# Patient Record
Sex: Female | Born: 1960 | Race: White | Hispanic: No | Marital: Married | Smoking: Never smoker
Health system: Southern US, Community
[De-identification: ages and names within clinical notes are randomized; demographics above are authoritative.]

## PROBLEM LIST (undated history)

## (undated) DIAGNOSIS — M1990 Unspecified osteoarthritis, unspecified site: Secondary | ICD-10-CM

## (undated) DIAGNOSIS — M5136 Other intervertebral disc degeneration, lumbar region: Secondary | ICD-10-CM

## (undated) DIAGNOSIS — M797 Fibromyalgia: Secondary | ICD-10-CM

## (undated) DIAGNOSIS — F329 Major depressive disorder, single episode, unspecified: Secondary | ICD-10-CM

## (undated) DIAGNOSIS — M5134 Other intervertebral disc degeneration, thoracic region: Secondary | ICD-10-CM

## (undated) DIAGNOSIS — M25511 Pain in right shoulder: Secondary | ICD-10-CM

## (undated) DIAGNOSIS — F32A Depression, unspecified: Secondary | ICD-10-CM

## (undated) DIAGNOSIS — E871 Hypo-osmolality and hyponatremia: Secondary | ICD-10-CM

## (undated) DIAGNOSIS — M3500 Sicca syndrome, unspecified: Secondary | ICD-10-CM

## (undated) DIAGNOSIS — M51369 Other intervertebral disc degeneration, lumbar region without mention of lumbar back pain or lower extremity pain: Secondary | ICD-10-CM

## (undated) DIAGNOSIS — T7840XA Allergy, unspecified, initial encounter: Secondary | ICD-10-CM

## (undated) DIAGNOSIS — E7800 Pure hypercholesterolemia, unspecified: Secondary | ICD-10-CM

## (undated) DIAGNOSIS — I10 Essential (primary) hypertension: Secondary | ICD-10-CM

## (undated) DIAGNOSIS — Q761 Klippel-Feil syndrome: Secondary | ICD-10-CM

## (undated) DIAGNOSIS — M069 Rheumatoid arthritis, unspecified: Secondary | ICD-10-CM

## (undated) DIAGNOSIS — K219 Gastro-esophageal reflux disease without esophagitis: Secondary | ICD-10-CM

## (undated) DIAGNOSIS — M26609 Unspecified temporomandibular joint disorder, unspecified side: Secondary | ICD-10-CM

## (undated) DIAGNOSIS — M5030 Other cervical disc degeneration, unspecified cervical region: Secondary | ICD-10-CM

## (undated) DIAGNOSIS — I7300 Raynaud's syndrome without gangrene: Secondary | ICD-10-CM

## (undated) HISTORY — DX: Allergy, unspecified, initial encounter: T78.40XA

## (undated) HISTORY — PX: CHOLECYSTECTOMY: SHX55

## (undated) HISTORY — DX: Hypo-osmolality and hyponatremia: E87.1

## (undated) HISTORY — PX: CARPAL TUNNEL RELEASE: SHX101

## (undated) HISTORY — DX: Depression, unspecified: F32.A

## (undated) HISTORY — DX: Pure hypercholesterolemia, unspecified: E78.00

## (undated) HISTORY — PX: ERCP: SHX60

## (undated) HISTORY — DX: Other intervertebral disc degeneration, thoracic region: M51.34

## (undated) HISTORY — DX: Klippel-Feil syndrome: Q76.1

## (undated) HISTORY — DX: Raynaud's syndrome without gangrene: I73.00

## (undated) HISTORY — DX: Sjogren syndrome, unspecified: M35.00

## (undated) HISTORY — DX: Rheumatoid arthritis, unspecified: M06.9

## (undated) HISTORY — DX: Pain in right shoulder: M25.511

## (undated) HISTORY — DX: Gastro-esophageal reflux disease without esophagitis: K21.9

## (undated) HISTORY — PX: KNEE SURGERY: SHX244

## (undated) HISTORY — PX: ABDOMINAL HYSTERECTOMY: SHX81

## (undated) HISTORY — DX: Unspecified temporomandibular joint disorder, unspecified side: M26.609

## (undated) HISTORY — DX: Essential (primary) hypertension: I10

## (undated) HISTORY — PX: APPENDECTOMY: SHX54

## (undated) HISTORY — DX: Unspecified osteoarthritis, unspecified site: M19.90

## (undated) HISTORY — DX: Fibromyalgia: M79.7

## (undated) HISTORY — PX: BACK SURGERY: SHX140

## (undated) HISTORY — DX: Other intervertebral disc degeneration, lumbar region: M51.36

## (undated) HISTORY — DX: Other cervical disc degeneration, unspecified cervical region: M50.30

## (undated) HISTORY — PX: TUBAL LIGATION: SHX77

## (undated) HISTORY — DX: Other intervertebral disc degeneration, lumbar region without mention of lumbar back pain or lower extremity pain: M51.369

## (undated) HISTORY — PX: OTHER SURGICAL HISTORY: SHX169

---
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
## 2012-10-28 ENCOUNTER — Other Ambulatory Visit: Payer: Self-pay | Admitting: Orthopedic Surgery

## 2012-10-28 DIAGNOSIS — M25562 Pain in left knee: Secondary | ICD-10-CM

## 2012-11-01 ENCOUNTER — Ambulatory Visit
Admission: RE | Admit: 2012-11-01 | Discharge: 2012-11-01 | Disposition: A | Discharge status: Home / Self Care | Payer: No Typology Code available for payment source | Source: Ambulatory Visit | Attending: Orthopedic Surgery | Admitting: Orthopedic Surgery

## 2012-11-01 DIAGNOSIS — M25562 Pain in left knee: Secondary | ICD-10-CM

## 2012-11-13 ENCOUNTER — Other Ambulatory Visit: Payer: Self-pay | Admitting: Orthopedic Surgery

## 2012-11-13 DIAGNOSIS — R52 Pain, unspecified: Secondary | ICD-10-CM

## 2012-11-16 ENCOUNTER — Ambulatory Visit
Admission: RE | Admit: 2012-11-16 | Discharge: 2012-11-16 | Disposition: A | Discharge status: Home / Self Care | Payer: No Typology Code available for payment source | Source: Ambulatory Visit | Attending: Orthopedic Surgery | Admitting: Orthopedic Surgery

## 2012-11-16 DIAGNOSIS — R52 Pain, unspecified: Secondary | ICD-10-CM

## 2013-01-23 ENCOUNTER — Other Ambulatory Visit (HOSPITAL_COMMUNITY): Payer: Self-pay | Admitting: Rheumatology

## 2013-01-23 DIAGNOSIS — Z780 Asymptomatic menopausal state: Secondary | ICD-10-CM

## 2013-01-31 ENCOUNTER — Other Ambulatory Visit (HOSPITAL_COMMUNITY): Payer: No Typology Code available for payment source

## 2014-07-22 ENCOUNTER — Ambulatory Visit (HOSPITAL_COMMUNITY): Payer: No Typology Code available for payment source | Admitting: Psychiatry

## 2014-08-29 ENCOUNTER — Other Ambulatory Visit: Payer: Self-pay | Admitting: Internal Medicine

## 2016-06-22 ENCOUNTER — Telehealth: Payer: Self-pay | Admitting: Rheumatology

## 2016-06-22 MED ORDER — ZOLPIDEM TARTRATE 10 MG PO TABS: 10.0000 mg | ORAL_TABLET | Freq: Every evening | ORAL | 0 refills | Status: DC | PRN

## 2016-06-22 NOTE — Telephone Encounter (Signed)
Last visit 04/19/16 Next visit 09/19/16 Ok to refill Ambien ?

## 2016-06-22 NOTE — Telephone Encounter (Signed)
Patient needs a refill of Ambien.

## 2016-06-22 NOTE — Telephone Encounter (Signed)
ok 

## 2016-06-26 ENCOUNTER — Telehealth: Payer: Self-pay | Admitting: Radiology

## 2016-06-26 NOTE — Telephone Encounter (Signed)
Received another request for Ambien, this has already been done

## 2016-07-11 ENCOUNTER — Other Ambulatory Visit: Payer: Self-pay | Admitting: Radiology

## 2016-07-11 NOTE — Telephone Encounter (Signed)
Refill request received via fax for [facility]ien[facility], this has already been sent

## 2016-07-19 ENCOUNTER — Other Ambulatory Visit: Payer: Self-pay | Admitting: Rheumatology

## 2016-07-20 NOTE — Telephone Encounter (Signed)
ok 

## 2016-07-20 NOTE — Telephone Encounter (Signed)
Last Visit: 04/19/16 Next Visit: 09/19/16 Labs: 06/01/16 WNL PLQ Eye Exam 8/ 2017 WNL  Okay to refill PLQ?

## 2016-08-08 ENCOUNTER — Other Ambulatory Visit: Payer: Self-pay | Admitting: Rheumatology

## 2016-08-09 NOTE — Telephone Encounter (Signed)
04/19/16 last visit 06/01/16 labs WNL 09/19/16 next visit  Ok to refill per Dr Corliss Skainseveshwar

## 2016-08-28 ENCOUNTER — Other Ambulatory Visit: Payer: Self-pay | Admitting: Rheumatology

## 2016-08-29 LAB — CBC WITH DIFFERENTIAL/PLATELET
BASOS ABS: 0 10*3/uL (ref 0.0–0.2)
Basos: 1 %
EOS (ABSOLUTE): 0.1 10*3/uL (ref 0.0–0.4)
Eos: 2 %
Hematocrit: 34.8 % (ref 34.0–46.6)
Hemoglobin: 11.7 g/dL (ref 11.1–15.9)
IMMATURE GRANS (ABS): 0 10*3/uL (ref 0.0–0.1)
IMMATURE GRANULOCYTES: 0 %
LYMPHS: 45 %
Lymphocytes Absolute: 2.6 10*3/uL (ref 0.7–3.1)
MCH: 30.2 pg (ref 26.6–33.0)
MCHC: 33.6 g/dL (ref 31.5–35.7)
MCV: 90 fL (ref 79–97)
MONOS ABS: 0.5 10*3/uL (ref 0.1–0.9)
Monocytes: 8 %
NEUTROS PCT: 44 %
Neutrophils Absolute: 2.6 10*3/uL (ref 1.4–7.0)
PLATELETS: 284 10*3/uL (ref 150–379)
RBC: 3.88 x10E6/uL (ref 3.77–5.28)
RDW: 13.8 % (ref 12.3–15.4)
WBC: 5.8 10*3/uL (ref 3.4–10.8)

## 2016-08-29 LAB — COMPREHENSIVE METABOLIC PANEL
A/G RATIO: 1.6 (ref 1.2–2.2)
ALT: 26 IU/L (ref 0–32)
AST: 29 IU/L (ref 0–40)
Albumin: 4.6 g/dL (ref 3.5–5.5)
Alkaline Phosphatase: 52 IU/L (ref 39–117)
BILIRUBIN TOTAL: 0.4 mg/dL (ref 0.0–1.2)
BUN/Creatinine Ratio: 14 (ref 9–23)
BUN: 8 mg/dL (ref 6–24)
CALCIUM: 9.8 mg/dL (ref 8.7–10.2)
CHLORIDE: 87 mmol/L — AB (ref 96–106)
CO2: 26 mmol/L (ref 18–29)
Creatinine, Ser: 0.57 mg/dL (ref 0.57–1.00)
GFR calc Af Amer: 119 mL/min/{1.73_m2} (ref >59)
GFR, EST NON AFRICAN AMERICAN: 103 mL/min/{1.73_m2} (ref >59)
GLUCOSE: 93 mg/dL (ref 65–99)
Globulin, Total: 2.8 g/dL (ref 1.5–4.5)
POTASSIUM: 4.4 mmol/L (ref 3.5–5.2)
Sodium: 128 mmol/L — ABNORMAL LOW (ref 134–144)
TOTAL PROTEIN: 7.4 g/dL (ref 6.0–8.5)

## 2016-08-29 NOTE — Progress Notes (Signed)
CBC, CMP normal

## 2016-09-04 ENCOUNTER — Other Ambulatory Visit: Payer: Self-pay | Admitting: Rheumatology

## 2016-09-04 NOTE — Telephone Encounter (Signed)
Patient needs a refill of Tramadol sent to [facility].

## 2016-09-05 MED ORDER — TRAMADOL HCL 50 MG PO TABS: ORAL_TABLET | ORAL | 0 refills | Status: DC

## 2016-09-05 NOTE — Telephone Encounter (Signed)
ok 

## 2016-09-05 NOTE — Telephone Encounter (Signed)
Last Visit: 04/19/16 Next Visit: 09/19/16 UDS: 04/24/16 Narc agreement: 04/19/16  Okay to refill Tramadol?

## 2016-09-06 ENCOUNTER — Encounter: Payer: Self-pay | Admitting: *Deleted

## 2016-09-06 DIAGNOSIS — Z79899 Other long term (current) drug therapy: Secondary | ICD-10-CM | POA: Insufficient documentation

## 2016-09-14 ENCOUNTER — Other Ambulatory Visit: Payer: Self-pay | Admitting: *Deleted

## 2016-09-14 MED ORDER — ZOLPIDEM TARTRATE 10 MG PO TABS: 10.0000 mg | ORAL_TABLET | Freq: Every evening | ORAL | 0 refills | Status: DC | PRN

## 2016-09-14 NOTE — Telephone Encounter (Signed)
Refill request for Ambien received via fax  Last Visit: 04/19/16 Next Visit: 09/19/16  Okay to refill Ambien?

## 2016-09-18 NOTE — Progress Notes (Signed)
Office Visit Note  Patient: Mary Lane             Date of Birth: Jan 04, 1961           MRN: [id number]             PCP: Lynn Ito, MD Referring: No ref. provider found Visit Date: 09/19/2016 Occupation: @GUAROCC @    Subjective:  Follow-up Follow-up on rheumatoid arthritis,  History of Present Illness: Mary Lane is a 56 y.o. female  Last seen 04/19/2016  Patient is having a flare of her fibromyalgia. She is complaining about bilateral trapezius muscle pain, neck pain, difficulty sleeping.  She is using her methotrexate 6 per week Plaquenil 20 mg twice a day Monday through Friday as requested for treatment of her rheumatoid arthritis. Patient is doing well. No flare. She also has Raynaud's but that is not flaring at this time.  She has OA of the hands and having mild discomfort.  She does have a narcotic agreement with our office and does have tramadol but she is trying to minimize use. She takes one pill every morning. We discussed the importance of minimizing use of tramadol and patient understands and is agreeable.  Currently she is using Ambien for her insomnia and she is doing well with the medication but she does not have full relief with Ambien alone. We discussed adding melatonin to the Ambien. If that works for the patient that we will continue that therapy. If that is not helpful, we can stop the Ambien and try Zanaflex or Flexeril instead.   Activities of Daily Living:  Patient reports morning stiffness for 15 minutes.   Patient Reports nocturnal pain.  Difficulty dressing/grooming: Reports Difficulty climbing stairs: Reports Difficulty getting out of chair: Reports Difficulty using hands for taps, buttons, cutlery, and/or writing: Denies   Review of Systems  Constitutional: Negative for fatigue.  HENT: Negative for mouth sores and mouth dryness.   Eyes: Negative for dryness.  Respiratory: Negative for shortness of breath.   Gastrointestinal:  Negative for constipation and diarrhea.  Musculoskeletal: Negative for myalgias and myalgias.  Skin: Negative for sensitivity to sunlight.  Psychiatric/Behavioral: Negative for decreased concentration and sleep disturbance.    PMFS History:  Patient Active Problem List   Diagnosis Date Noted  . High risk medication use 09/06/2016    No past medical history on file.  No family history on file. No past surgical history on file. Social History   Social History Narrative  . No narrative on file     Objective: Vital Signs: BP 122/64   Pulse 68   Resp 16   Ht 5\' 5"  (1.651 m)   Wt 125 lb (56.7 kg)   BMI 20.80 kg/m    Physical Exam  Constitutional: She is oriented to person, place, and time. She appears well-developed and well-nourished.  HENT:  Head: Normocephalic and atraumatic.  Eyes: EOM are normal. Pupils are equal, round, and reactive to light.  Cardiovascular: Normal rate, regular rhythm and normal heart sounds.  Exam reveals no gallop and no friction rub.   No murmur heard. Pulmonary/Chest: Effort normal and breath sounds normal. She has no wheezes. She has no rales.  Abdominal: Soft. Bowel sounds are normal. She exhibits no distension. There is no tenderness. There is no guarding. No hernia.  Musculoskeletal: Normal range of motion. She exhibits no edema, tenderness or deformity.  Lymphadenopathy:    She has no cervical adenopathy.  Neurological: She is alert and oriented to person,  place, and time. Coordination normal.  Skin: Skin is warm and dry. Capillary refill takes less than 2 seconds. No rash noted.  Psychiatric: She has a normal mood and affect. Her behavior is normal.  Nursing note and vitals reviewed.    Musculoskeletal Exam:  Full range of motion of all joints Grip strength is equal and strong bilaterally Fiber myalgia tender points are 18 out of 18 positive  CDAI Exam: CDAI Homunculus Exam:   Joint Counts:  CDAI Tender Joint count: 0 CDAI Swollen  Joint count: 0  Global Assessments:  Patient Global Assessment: 10 Provider Global Assessment: 10  CDAI Calculated Score: 20  No synovitis on examination  Investigation: No additional findings. Orders Only on 08/28/2016  Component Date Value Ref Range Status  . WBC 08/28/2016 5.8  3.4 - 10.8 x10E3/uL Final  . RBC 08/28/2016 3.88  3.77 - 5.28 x10E6/uL Final  . Hemoglobin 08/28/2016 11.7  11.1 - 15.9 g/dL Final  . Hematocrit 16/10/960401/15/2018 34.8  34.0 - 46.6 % Final  . MCV 08/28/2016 90  79 - 97 fL Final  . MCH 08/28/2016 30.2  26.6 - 33.0 pg Final  . MCHC 08/28/2016 33.6  31.5 - 35.7 g/dL Final  . RDW 54/09/811901/15/2018 13.8  12.3 - 15.4 % Final  . Platelets 08/28/2016 284  150 - 379 x10E3/uL Final  . Neutrophils 08/28/2016 44  Not Estab. % Final  . Lymphs 08/28/2016 45  Not Estab. % Final  . Monocytes 08/28/2016 8  Not Estab. % Final  . Eos 08/28/2016 2  Not Estab. % Final  . Basos 08/28/2016 1  Not Estab. % Final  . Neutrophils Absolute 08/28/2016 2.6  1.4 - 7.0 x10E3/uL Final  . Lymphocytes Absolute 08/28/2016 2.6  0.7 - 3.1 x10E3/uL Final  . Monocytes Absolute 08/28/2016 0.5  0.1 - 0.9 x10E3/uL Final  . EOS (ABSOLUTE) 08/28/2016 0.1  0.0 - 0.4 x10E3/uL Final  . Basophils Absolute 08/28/2016 0.0  0.0 - 0.2 x10E3/uL Final  . Immature Granulocytes 08/28/2016 0  Not Estab. % Final  . Immature Grans (Abs) 08/28/2016 0.0  0.0 - 0.1 x10E3/uL Final  . Glucose 08/28/2016 93  65 - 99 mg/dL Final  . BUN 14/78/295601/15/2018 8  6 - 24 mg/dL Final  . Creatinine, Ser 08/28/2016 0.57  0.57 - 1.00 mg/dL Final  . GFR calc non Af Amer 08/28/2016 103  >59 mL/min/1.73 Final  . GFR calc Af Amer 08/28/2016 119  >59 mL/min/1.73 Final  . BUN/Creatinine Ratio 08/28/2016 14  9 - 23 Final  . Sodium 08/28/2016 128* 134 - 144 mmol/L Final  . Potassium 08/28/2016 4.4  3.5 - 5.2 mmol/L Final  . Chloride 08/28/2016 87* 96 - 106 mmol/L Final  . CO2 08/28/2016 26  18 - 29 mmol/L Final  . Calcium 08/28/2016 9.8  8.7 - 10.2  mg/dL Final  . Total Protein 08/28/2016 7.4  6.0 - 8.5 g/dL Final  . Albumin 21/30/865701/15/2018 4.6  3.5 - 5.5 g/dL Final  . Globulin, Total 08/28/2016 2.8  1.5 - 4.5 g/dL Final  . Albumin/Globulin Ratio 08/28/2016 1.6  1.2 - 2.2 Final  . Bilirubin Total 08/28/2016 0.4  0.0 - 1.2 mg/dL Final  . Alkaline Phosphatase 08/28/2016 52  39 - 117 IU/L Final  . AST 08/28/2016 29  0 - 40 IU/L Final  . ALT 08/28/2016 26  0 - 32 IU/L Final   2  Imaging: No results found.  Speciality Comments: No specialty comments available.    Procedures:  Trigger Point Inj Date/Time: 09/19/2016 11:03 AM Performed by: Tawni Pummel Authorized by: Tawni Pummel   Consent Given by:  Patient Site marked: the procedure site was marked   Timeout: prior to procedure the correct patient, procedure, and site was verified   Indications:  Muscle spasm and pain Total # of Trigger Points:  2 Location: neck   Needle Size:  27 G Approach:  Dorsal Medications #1:  0.5 mL lidocaine 1 %; 10 mg triamcinolone acetonide 40 MG/ML Medications #2:  0.5 mL lidocaine 1 %; 10 mg triamcinolone acetonide 40 MG/ML Patient tolerance:  Patient tolerated the procedure well with no immediate complications   Allergies: Ivp dye [iodinated diagnostic agents] and Sulfa antibiotics   Assessment / Plan:     Visit Diagnoses: High risk medication use - Sep 19, 2016==>plq 200mg  bid m-f ; MTX 6/wk;plq eye exam uptodate-[facility]  Rheumatoid arthritis with rheumatoid factor of multiple sites without organ or systems involvement (HCC)  Raynaud's disease without gangrene  Trapezius muscle spasm  Fibromyalgia  Chronic fatigue  Primary insomnia  Primary osteoarthritis of both hands    Plan: #1: Rheumatoid arthritis, well controlled. No synovitis on examination  #2:. Fibromyalgia. Bilateral trapezius muscle spasms. After informed consent was obtained the site was prepped in usual sterile fashion injected with  10 mg of Kenalog mixed with 0.5 and also 1% lidocaine. Patient tolerated procedure well. I'll call the patient's  #3: New prescription for baclofen. 10 mg 1 by mouth 3 times a day when necessary dispense 30 days supply with 5 refills  #4: Add melatonin to Ambien. The combination of the medications may assist the patient getting a better night sleep. If that doesn't work consider stopping the Ambien and switching it to Flexeril at night. Under those circumstances she will not take baclofen at night.  #5: No med refills needed at this time  #6: CBC with differential and CMP with GFR done 08/28/2016 over normal and up-to-date.  #7: Bilateral trapezius muscle spasms. A couple of minutes after the injection, patient's pain went from a 10  To  8. She is very happy that she is getting relief already. We are both hopeful that she will have even further relief over the next 2-3 days and her pain may subside fully  Orders: Orders Placed This Encounter  Procedures  . Trigger Point Injection  . Trigger Point Injection   No orders of the defined types were placed in this encounter.   Face-to-face time spent with patient was 30 minutes. 50% of time was spent in counseling and coordination of care.  Follow-Up Instructions: Return in about 5 months (around 02/16/2017) for RA; MTX 6/WK;PLQ 200mg  bid m-f;; FMS, trap mus pain;.   Nigel Ericsson, PA-C  Note - This record has been created using [employer].  Chart creation errors have been sought, but may not always  have been located. Such creation errors do not reflect on  the standard of medical care.

## 2016-09-19 ENCOUNTER — Encounter: Payer: Self-pay | Admitting: Rheumatology

## 2016-09-19 ENCOUNTER — Ambulatory Visit (INDEPENDENT_AMBULATORY_CARE_PROVIDER_SITE_OTHER): Payer: Medicare Other | Admitting: Rheumatology

## 2016-09-19 VITALS — BP 122/64 | HR 68 | Resp 16 | Ht 65.0 in | Wt 125.0 lb

## 2016-09-19 DIAGNOSIS — M19042 Primary osteoarthritis, left hand: Secondary | ICD-10-CM | POA: Diagnosis not present

## 2016-09-19 DIAGNOSIS — R5382 Chronic fatigue, unspecified: Secondary | ICD-10-CM

## 2016-09-19 DIAGNOSIS — M0579 Rheumatoid arthritis with rheumatoid factor of multiple sites without organ or systems involvement: Secondary | ICD-10-CM | POA: Diagnosis not present

## 2016-09-19 DIAGNOSIS — F5101 Primary insomnia: Secondary | ICD-10-CM

## 2016-09-19 DIAGNOSIS — Z79899 Other long term (current) drug therapy: Secondary | ICD-10-CM

## 2016-09-19 DIAGNOSIS — I7300 Raynaud's syndrome without gangrene: Secondary | ICD-10-CM | POA: Diagnosis not present

## 2016-09-19 DIAGNOSIS — M62838 Other muscle spasm: Secondary | ICD-10-CM

## 2016-09-19 DIAGNOSIS — M19041 Primary osteoarthritis, right hand: Secondary | ICD-10-CM | POA: Diagnosis not present

## 2016-09-19 DIAGNOSIS — M797 Fibromyalgia: Secondary | ICD-10-CM | POA: Diagnosis not present

## 2016-09-19 MED ORDER — LIDOCAINE HCL 1 % IJ SOLN
0.5000 mL | INTRAMUSCULAR | Status: AC | PRN
  Administered 2016-09-19: .5 mL

## 2016-09-19 MED ORDER — TRIAMCINOLONE ACETONIDE 40 MG/ML IJ SUSP
10.0000 mg | INTRAMUSCULAR | Status: AC | PRN
  Administered 2016-09-19: 10 mg via INTRAMUSCULAR

## 2016-09-20 ENCOUNTER — Telehealth: Payer: Self-pay | Admitting: Rheumatology

## 2016-09-20 MED ORDER — BACLOFEN 10 MG PO TABS: 10.0000 mg | ORAL_TABLET | Freq: Two times a day (BID) | ORAL | 5 refills | Status: DC

## 2016-09-20 NOTE — Telephone Encounter (Signed)
Patient was told by Mr. Leane Callanwala that he was sending in an rx for Baclafin. Please send to her mail order or local pharmacy

## 2016-09-20 NOTE — Telephone Encounter (Signed)
Prescription sent to the pharmacy and patient advised. Patient advised if taking muscle relaxer during the day to stop. Patient states she is not taking any during the day.

## 2016-09-20 NOTE — Telephone Encounter (Signed)
Okay to prescribe baclofen 10 mg twice a day dispense 30 days supply with 5 refillsIf using other muscle relaxer during the daytime, please stop.

## 2016-09-20 NOTE — Telephone Encounter (Signed)
Were you planning on sending in a prescription for Baclofen for this patient?

## 2016-10-02 ENCOUNTER — Telehealth: Payer: Self-pay | Admitting: Rheumatology

## 2016-10-02 NOTE — Telephone Encounter (Signed)
If baclofen is giving that side effect, stop baclofenOkay to prescribe Robaxin 500 mg in the morning and 500 mg in the evening when necessary muscle spasm.Okay to give 60 pills with 2 refills.At bedtime, Flexeril is fine to use if you not using any other muscle relaxers a sleep agent.Okay to prescribe Flexeril 10 mg; 1 by mouth daily at bedtime when necessary muscle spasmDispense 30 pills with 2 refills

## 2016-10-02 NOTE — Telephone Encounter (Signed)
Patient called stated that medication that Mr. Leane Callanwala prescribed for her on the 6th is making her chest hurt.  She said he stated that he would try her on Flexeril if the other medicine did not work.  [id number]#[phone]#[phone].  Thank you.

## 2016-10-02 NOTE — Telephone Encounter (Signed)
Patient states she was given a prescription for Baclofen and states that when she took the medicine it gave her pain in her chest. Patient is longer taking the Baclofen. Patient states that at her appointment you mentioned giving Flexeril at bedtime. Will you change her prescription?

## 2016-10-03 MED ORDER — CYCLOBENZAPRINE HCL 10 MG PO TABS: 10.0000 mg | ORAL_TABLET | Freq: Every day | ORAL | 2 refills | Status: DC

## 2016-10-03 MED ORDER — METHOCARBAMOL 500 MG PO TABS: ORAL_TABLET | ORAL | 2 refills | Status: DC

## 2016-10-03 NOTE — Telephone Encounter (Signed)
Patient advised of recommendation of Robaxin in the morning and in the evening as needed for muscle spasms and the flexeril at bedtime. Patient verbalized understanding and prescriptions sent to pharmacy.

## 2016-10-03 NOTE — Addendum Note (Signed)
Addended by: Henriette CombsHATTON, Alauna Hayden L on: 10/03/2016 09:50 AM   Modules accepted: Orders

## 2016-10-30 ENCOUNTER — Other Ambulatory Visit: Payer: Self-pay | Admitting: Rheumatology

## 2016-10-30 NOTE — Telephone Encounter (Signed)
Last Visit: 09/19/16 Next Visit: 02/16/17 Labs: 08/28/16 WNL  Okay to refill MTX?

## 2016-12-17 ENCOUNTER — Other Ambulatory Visit: Payer: Self-pay | Admitting: Rheumatology

## 2016-12-18 NOTE — Telephone Encounter (Signed)
Last Visit: 09/19/16 Next Visit: 02/16/17 Labs: 08/28/16 WNL PLQ Eye Exam: 04/13/16 WNL  Okay to refill PLQ?

## 2016-12-18 NOTE — Telephone Encounter (Signed)
ok 

## 2016-12-22 ENCOUNTER — Other Ambulatory Visit: Payer: Self-pay | Admitting: Rheumatology

## 2016-12-22 MED ORDER — TRAMADOL HCL 50 MG PO TABS
ORAL_TABLET | ORAL | 0 refills | Status: DC
Start: 2016-12-22 — End: 2017-05-01

## 2016-12-22 MED ORDER — CYCLOBENZAPRINE HCL 10 MG PO TABS: 10.0000 mg | ORAL_TABLET | Freq: Every day | ORAL | 2 refills | Status: DC

## 2016-12-22 MED ORDER — METHOCARBAMOL 500 MG PO TABS
ORAL_TABLET | ORAL | 2 refills | Status: DC
Start: 2016-12-22 — End: 2017-08-28

## 2016-12-22 NOTE — Telephone Encounter (Signed)
Last Visit: 09/19/16 Next Visit: 02/16/17 UDS: 04/24/16 Narc agreement: 04/19/16  Okay to refill Tramadol, Flexeril and Robaxin?

## 2016-12-22 NOTE — Telephone Encounter (Signed)
Patient requesting a rf for her Flexeril, Robaxin, and Ultram. [facility] uses [facility]. Please call patient when meds refilled.

## 2016-12-26 ENCOUNTER — Other Ambulatory Visit: Payer: Self-pay | Admitting: Rheumatology

## 2017-01-21 ENCOUNTER — Other Ambulatory Visit: Payer: Self-pay | Admitting: Rheumatology

## 2017-01-22 NOTE — Telephone Encounter (Signed)
Last Visit: 10/09/16 Next Visit: 02/16/17  Okay to refill Folic Acid?

## 2017-01-23 ENCOUNTER — Other Ambulatory Visit: Payer: Self-pay | Admitting: Rheumatology

## 2017-01-23 DIAGNOSIS — Z79899 Other long term (current) drug therapy: Secondary | ICD-10-CM

## 2017-01-23 NOTE — Telephone Encounter (Signed)
Last Visit: 10/09/16 Next Visit: 02/16/17 Labs: 08/28/16 WNL Patient to update labs this week.   Okay to refill 30 day supply MTX?

## 2017-01-26 ENCOUNTER — Telehealth: Payer: Self-pay

## 2017-01-26 NOTE — Telephone Encounter (Signed)
A prior authorization for Methotrexate has been submitted via cover my meds. Will update once we receive a response.   [name], [place], CPhT 3:07 PM

## 2017-01-30 ENCOUNTER — Telehealth: Payer: Self-pay | Admitting: Pharmacist

## 2017-01-30 MED ORDER — METHOTREXATE 2.5 MG PO TABS: ORAL_TABLET | ORAL | 0 refills | Status: DC

## 2017-01-30 NOTE — Telephone Encounter (Signed)
Resent prescription to pharmacy, as they state they did not receive it

## 2017-01-30 NOTE — Telephone Encounter (Signed)
Received denial from patient's insurance for methotrexate.  I called patient to discuss.  Left a message asking her to call me back.   Lilla Shookachel Poppy Mcafee, Pharm.D., BCPS, CPP Clinical Pharmacist Pager: [phone] Phone: [phone] 01/30/2017 3:00 PM

## 2017-01-30 NOTE — Telephone Encounter (Signed)
Spoke to [employer] [facility] from [facility]. He states that a prior authorization is not required for methotrexate. They did not receive the escript from 01/23/17. Can you re-send the Rx to the pharmacy for Methotrexate?

## 2017-02-09 ENCOUNTER — Ambulatory Visit (INDEPENDENT_AMBULATORY_CARE_PROVIDER_SITE_OTHER): Payer: Medicare Other | Admitting: Rheumatology

## 2017-02-09 ENCOUNTER — Encounter: Payer: Self-pay | Admitting: Rheumatology

## 2017-02-09 VITALS — Resp 14 | Ht 64.75 in | Wt 124.0 lb

## 2017-02-09 DIAGNOSIS — Z79899 Other long term (current) drug therapy: Secondary | ICD-10-CM | POA: Diagnosis not present

## 2017-02-09 DIAGNOSIS — M47816 Spondylosis without myelopathy or radiculopathy, lumbar region: Secondary | ICD-10-CM | POA: Diagnosis not present

## 2017-02-09 DIAGNOSIS — M19042 Primary osteoarthritis, left hand: Secondary | ICD-10-CM | POA: Diagnosis not present

## 2017-02-09 DIAGNOSIS — I7300 Raynaud's syndrome without gangrene: Secondary | ICD-10-CM | POA: Diagnosis not present

## 2017-02-09 DIAGNOSIS — M19041 Primary osteoarthritis, right hand: Secondary | ICD-10-CM

## 2017-02-09 DIAGNOSIS — M0579 Rheumatoid arthritis with rheumatoid factor of multiple sites without organ or systems involvement: Secondary | ICD-10-CM | POA: Diagnosis not present

## 2017-02-09 DIAGNOSIS — M797 Fibromyalgia: Secondary | ICD-10-CM

## 2017-02-09 LAB — CBC WITH DIFFERENTIAL/PLATELET
BASOS PCT: 1 %
Basophils Absolute: 53 cells/uL (ref 0–200)
EOS ABS: 53 {cells}/uL (ref 15–500)
EOS PCT: 1 %
HCT: 34.4 % — ABNORMAL LOW (ref 35.0–45.0)
Hemoglobin: 11.2 g/dL — ABNORMAL LOW (ref 11.7–15.5)
Lymphocytes Relative: 26 %
Lymphs Abs: 1378 cells/uL (ref 850–3900)
MCH: 30 pg (ref 27.0–33.0)
MCHC: 32.6 g/dL (ref 32.0–36.0)
MCV: 92.2 fL (ref 80.0–100.0)
MONOS PCT: 8 %
MPV: 9.6 fL (ref 7.5–12.5)
Monocytes Absolute: 424 cells/uL (ref 200–950)
NEUTROS ABS: 3392 {cells}/uL (ref 1500–7800)
Neutrophils Relative %: 64 %
PLATELETS: 410 10*3/uL — AB (ref 140–400)
RBC: 3.73 MIL/uL — AB (ref 3.80–5.10)
RDW: 14.1 % (ref 11.0–15.0)
WBC: 5.3 10*3/uL (ref 3.8–10.8)

## 2017-02-09 NOTE — Progress Notes (Signed)
Office Visit Note  Patient: Mary Lane             Date of Birth: 02/25/1961           MRN: [id number]             PCP: Lynn ItoSmith, Lori, MD Referring: Lynn ItoSmith, Lori, MD Visit Date: 02/09/2017 Occupation: @GUAROCC @    Subjective:  Joint pain.  History of Present Illness: Mary Lane is a 56 y.o. female who was last seen in our office on 09/19/2016 for trapezius muscle injections for her fibromyalgia. Prior to that she was seen 04/19/2016 (please see SRS) for rheumatoid arthritis with positive rheumatoid factor, positive ANA, Raynaud's (nonactive).   Patient is on methotrexate 6 tablets per week Folic acid 2 mg per day Plaquenil 200 mg twice a day Monday through Friday   For fibromyalgia===> For insomnia, patient uses flexeril (10mg  qhs) & melatonin (10mg  qhs) ==> works well. Robaxin for daytime muscle relaxer --> (baclofen in past)  Patient is stopped using Ambien since Flexeril and melatonin are working well for insomnia.  Pt is doing well w/ fms; RA; raynauds ( no flare). Right hip w/ recent irritation of sciatic nerve.  Activities of Daily Living:  Patient reports morning stiffness for 30 minutes.   Patient Reports nocturnal pain.  Difficulty dressing/grooming: Denies Difficulty climbing stairs: Denies Difficulty getting out of chair: Denies Difficulty using hands for taps, buttons, cutlery, and/or writing: Denies   Review of Systems  Constitutional: Positive for fatigue.  HENT: Negative for mouth sores and mouth dryness.   Eyes: Negative for dryness.  Respiratory: Negative for shortness of breath.   Gastrointestinal: Negative for constipation and diarrhea.  Musculoskeletal: Positive for myalgias and myalgias.  Skin: Negative for sensitivity to sunlight.  Psychiatric/Behavioral: Positive for sleep disturbance. Negative for decreased concentration.    PMFS History:  Patient Active Problem List   Diagnosis Date Noted  . High risk medication use 09/06/2016      History reviewed. No pertinent past medical history.  No family history on file. Past Surgical History:  Procedure Laterality Date  . ABDOMINAL HYSTERECTOMY    . APPENDECTOMY    . CARPAL TUNNEL RELEASE Bilateral   . CHOLECYSTECTOMY    . ERCP    . KNEE SURGERY Bilateral   . TUBAL LIGATION     Social History   Social History Narrative  . No narrative on file     Objective: Vital Signs: Resp 14   Ht 5' 4.75" (1.645 m)   Wt 124 lb (56.2 kg)   BMI 20.79 kg/m    Physical Exam  Constitutional: She is oriented to person, place, and time. She appears well-developed and well-nourished.  HENT:  Head: Normocephalic and atraumatic.  Eyes: EOM are normal. Pupils are equal, round, and reactive to light.  Cardiovascular: Normal rate, regular rhythm and normal heart sounds.  Exam reveals no gallop and no friction rub.   No murmur heard. Pulmonary/Chest: Effort normal and breath sounds normal. She has no wheezes. She has no rales.  Abdominal: Soft. Bowel sounds are normal. She exhibits no distension. There is no tenderness. There is no guarding. No hernia.  Musculoskeletal: Normal range of motion. She exhibits no edema, tenderness or deformity.  Lymphadenopathy:    She has no cervical adenopathy.  Neurological: She is alert and oriented to person, place, and time. Coordination normal.  Skin: Skin is warm and dry. Capillary refill takes less than 2 seconds. No rash noted.  Psychiatric: She has a  normal mood and affect. Her behavior is normal.  Nursing note and vitals reviewed.    Musculoskeletal Exam:    CDAI Exam: CDAI Homunculus Exam:   Joint Counts:  CDAI Tender Joint count: 0 CDAI Swollen Joint count: 0     Investigation: Findings:  TB gold is negative June 2015  Orders Only on 08/28/2016  Component Date Value Ref Range Status  . WBC 08/28/2016 5.8  3.4 - 10.8 x10E3/uL Final  . RBC 08/28/2016 3.88  3.77 - 5.28 x10E6/uL Final  . Hemoglobin 08/28/2016 11.7  11.1 -  15.9 g/dL Final  . Hematocrit 16/05/9603 34.8  34.0 - 46.6 % Final  . MCV 08/28/2016 90  79 - 97 fL Final  . MCH 08/28/2016 30.2  26.6 - 33.0 pg Final  . MCHC 08/28/2016 33.6  31.5 - 35.7 g/dL Final  . RDW 54/04/8118 13.8  12.3 - 15.4 % Final  . Platelets 08/28/2016 284  150 - 379 x10E3/uL Final  . Neutrophils 08/28/2016 44  Not Estab. % Final  . Lymphs 08/28/2016 45  Not Estab. % Final  . Monocytes 08/28/2016 8  Not Estab. % Final  . Eos 08/28/2016 2  Not Estab. % Final  . Basos 08/28/2016 1  Not Estab. % Final  . Neutrophils Absolute 08/28/2016 2.6  1.4 - 7.0 x10E3/uL Final  . Lymphocytes Absolute 08/28/2016 2.6  0.7 - 3.1 x10E3/uL Final  . Monocytes Absolute 08/28/2016 0.5  0.1 - 0.9 x10E3/uL Final  . EOS (ABSOLUTE) 08/28/2016 0.1  0.0 - 0.4 x10E3/uL Final  . Basophils Absolute 08/28/2016 0.0  0.0 - 0.2 x10E3/uL Final  . Immature Granulocytes 08/28/2016 0  Not Estab. % Final  . Immature Grans (Abs) 08/28/2016 0.0  0.0 - 0.1 x10E3/uL Final  . Glucose 08/28/2016 93  65 - 99 mg/dL Final  . BUN 14/78/2956 8  6 - 24 mg/dL Final  . Creatinine, Ser 08/28/2016 0.57  0.57 - 1.00 mg/dL Final  . GFR calc non Af Amer 08/28/2016 103  >59 mL/min/1.73 Final  . GFR calc Af Amer 08/28/2016 119  >59 mL/min/1.73 Final  . BUN/Creatinine Ratio 08/28/2016 14  9 - 23 Final  . Sodium 08/28/2016 128* 134 - 144 mmol/L Final  . Potassium 08/28/2016 4.4  3.5 - 5.2 mmol/L Final  . Chloride 08/28/2016 87* 96 - 106 mmol/L Final  . CO2 08/28/2016 26  18 - 29 mmol/L Final  . Calcium 08/28/2016 9.8  8.7 - 10.2 mg/dL Final  . Total Protein 08/28/2016 7.4  6.0 - 8.5 g/dL Final  . Albumin 21/30/8657 4.6  3.5 - 5.5 g/dL Final  . Globulin, Total 08/28/2016 2.8  1.5 - 4.5 g/dL Final  . Albumin/Globulin Ratio 08/28/2016 1.6  1.2 - 2.2 Final  . Bilirubin Total 08/28/2016 0.4  0.0 - 1.2 mg/dL Final  . Alkaline Phosphatase 08/28/2016 52  39 - 117 IU/L Final  . AST 08/28/2016 29  0 - 40 IU/L Final  . ALT 08/28/2016 26  0  - 32 IU/L Final      Imaging: No results found.  Speciality Comments: No specialty comments available.    Procedures:  No procedures performed Allergies: Ivp dye [iodinated diagnostic agents] and Sulfa antibiotics   Assessment / Plan:     Visit Diagnoses: Rheumatoid arthritis with rheumatoid factor of multiple sites without organ or systems involvement (HCC)  Fibromyalgia  Raynaud's disease without gangrene  High risk medication use - Plan: CBC with Differential/Platelet  Primary osteoarthritis of both hands  Spondylosis  of lumbar region without myelopathy or radiculopathy   Plan: #1: Rheumatoid arthritis Well-controlled. Morning stiffness is 30 minutes or less. No joint pain stiffness or swelling. Appears to be old synovial thickening of bilateral second MCP joints and first MCP joint. Overall, patient has minimal pain. We discussed the importance of recognizing warmth and swelling that could be acute and needs treatment then methotrexate and Plaquenil. Patient understands and is agreeable. If the pain is significant enough, patient will let us know and we can schedule for an ultrasound to make sure it's not any synovitis that needs more aggressive treatment.  #2: High-risk prescription. Methotrexate 6 pills per week Folic acid 2 mg per day Plaquenil 20 mg twice a day Monday through Friday only. Plaquenil eye exam is normal and up-to-date (which was January/February 2018).  Patient has BMP from her PCPs office dated 12/28/2016 which shows normal BMP with creatinine at 0.58, GFR greater than 60. Note, no LFTs were done on this test. Calcium is normal at 9.8  Since we do not have the CBC for the patient, we will do CBC with differential today.  #3: Right SI joint pain secondary to sacroiliitis versus sciatica. On today's examination, it is unclear whether patient is suffering from sacroiliitis that we should inject with cortisone and lidocaine versus sciatica that  was diagnosed recently by her PCP. Offered the patient cortisone injection but patient declined for the moment. In its place, we decided to do Voltaren gel. Breath assist patient has adequate amount of Voltaren gel at home.) If patient continues to have problems, we can have an appointment for an injection and/or we can also refer patient to [facility] for evaluation and treatment of right sacroiliac pain  #4: Insomnia. Patient is currently using Flexeril and melatonin (each at 10 mg) with very good relief. She uses it every night. She used to use Ambien but she is tapered off of the Ambien and does not rely on that anymore. Patient uses Robaxin during the daytime for muscle relaxation on a when necessary basis. (Was using baclofen at one time but she has discontinued baclofen).  #5: Patient had bone density done about a year ago by her PCP. The DEXA was normal about 2017  She is scheduled to get a repeat bone density in about 5 years. Patient is aware to let us know if there is any kind of fractures that happens that is nontraumatic, we may need to repeat the bone density.  #5: Return to clinic in 5 months  #6:Schedule patient for ultrasound of bilateral hands and wrist at the next available; synovial thickening of bilateral first and second MCP joint; rule out synovitis and inadequate response to methotrexate and Plaquenil treatment   Orders: Orders Placed This Encounter  Procedures  . CBC with Differential/Platelet   No orders of the defined types were placed in this encounter.   Face-to-face time spent with patient was 30 minutes. 50% of time was spent in counseling and coordination of care.  Follow-Up Instructions: Return in about 5 months (around 07/12/2017) for RA,, FMS, FATIGUE,INSOMNIA, rt SI jt pain. I examined and evaluated patient. She appears to have some synovial thickening but no active synovitis was noted on the clinical exam. I'll schedule ultrasound of bilateral  hands to look for synovitis.  Pollyann Savoy, MD  Note - This record has been created using [employer] nutritionist.  Chart creation errors have been sought, but may not always  have been located. Such creation errors do not reflect  on  the standard of medical care.

## 2017-02-12 NOTE — Progress Notes (Signed)
CBC normal, mild anemia

## 2017-02-16 ENCOUNTER — Ambulatory Visit: Payer: Medicare Other | Admitting: Rheumatology

## 2017-02-16 ENCOUNTER — Other Ambulatory Visit: Payer: Self-pay | Admitting: Rheumatology

## 2017-02-16 NOTE — Telephone Encounter (Signed)
Last Visit: 02/09/17 Next Visit: 05/02/17  Okay to refill per Dr. Corliss Skainseveshwar

## 2017-02-27 ENCOUNTER — Other Ambulatory Visit: Payer: Self-pay | Admitting: Orthopedic Surgery

## 2017-02-27 DIAGNOSIS — M25561 Pain in right knee: Secondary | ICD-10-CM

## 2017-02-27 DIAGNOSIS — M2391 Unspecified internal derangement of right knee: Secondary | ICD-10-CM

## 2017-03-02 ENCOUNTER — Telehealth: Payer: Self-pay

## 2017-03-02 MED ORDER — METHOTREXATE 2.5 MG PO TABS: ORAL_TABLET | ORAL | 0 refills | Status: DC

## 2017-03-02 NOTE — Telephone Encounter (Signed)
Patient would like a [facility]  CB# is [phone].  Please advise.  Thank You.

## 2017-03-02 NOTE — Telephone Encounter (Signed)
Last Visit: 02/09/17 Next Visit: 05/02/17 Labs: 02/09/17 Mild Anemia  Okay to refill per Dr. Corliss Skainseveshwar

## 2017-03-05 ENCOUNTER — Telehealth: Payer: Self-pay

## 2017-03-05 NOTE — Telephone Encounter (Addendum)
Received a prior auth request for MTX from specialty pharmacy. Submitted a request via cover my meds. Patient was denied from her primary [facility]([employer] Medicare).   Called pharmacy to give them the update. [place] to [place] who states that the patient has been receiving paided claims from her secondary, [employer] insurance at $7. They did not the Rx that was submitted on 7/20. Can you please resend the Rx? Thanks!  Reference number: [id number]  Abran DukeHopkins, Stanley Lyness, CPhT 2:31 PM

## 2017-03-06 ENCOUNTER — Other Ambulatory Visit: Payer: Self-pay | Admitting: Rheumatology

## 2017-03-06 MED ORDER — METHOTREXATE 2.5 MG PO TABS: ORAL_TABLET | ORAL | 0 refills | Status: DC

## 2017-03-06 NOTE — Telephone Encounter (Signed)
Prescription re-sent to pharmacy.

## 2017-03-06 NOTE — Addendum Note (Signed)
Addended by: Henriette CombsHATTON, Maliki Gignac L on: 03/06/2017 10:28 AM   Modules accepted: Orders

## 2017-03-12 ENCOUNTER — Other Ambulatory Visit: Payer: Self-pay

## 2017-03-12 ENCOUNTER — Other Ambulatory Visit: Payer: Self-pay | Admitting: Rheumatology

## 2017-03-12 MED ORDER — METHOTREXATE 2.5 MG PO TABS: ORAL_TABLET | ORAL | 0 refills | Status: DC

## 2017-03-12 NOTE — Telephone Encounter (Signed)
Patient calling because pharmacy did not get one of her scripts. [facility]. Please resend. Call patient to conform rx is sent.

## 2017-03-12 NOTE — Telephone Encounter (Signed)
30d

## 2017-03-12 NOTE — Telephone Encounter (Signed)
Patient states she has not received her prescription from [facility]. Patient is requesting a 30 day supply from local pharmacy.   Last Visit: 02/09/17 Next Visit: 05/02/17 Labs: 02/09/17 Mild anemia   Okay to Send 30 supply to local pharmacy?

## 2017-03-13 ENCOUNTER — Telehealth: Payer: Self-pay | Admitting: Rheumatology

## 2017-03-13 MED ORDER — METHOTREXATE 2.5 MG PO TABS: ORAL_TABLET | ORAL | 0 refills | Status: DC

## 2017-03-13 NOTE — Telephone Encounter (Signed)
Prescription resent to [facility].  

## 2017-03-13 NOTE — Telephone Encounter (Signed)
Patient called back to let you know pharmacy ([facility])  states they did not receive rx for MTX. They will fax another request to us. Please call patient with any questions.

## 2017-03-30 ENCOUNTER — Other Ambulatory Visit: Payer: Self-pay | Admitting: Rheumatology

## 2017-03-30 MED ORDER — METHOTREXATE 2.5 MG PO TABS: ORAL_TABLET | ORAL | 0 refills | Status: DC

## 2017-03-30 NOTE — Telephone Encounter (Signed)
Have resent for 3 month supply, previous Rx was one month supply, they have sent faxes they needed to fill it in three month supply only. Called the number, they have asked for [place] to resubmit.

## 2017-03-30 NOTE — Telephone Encounter (Signed)
Patient states that [facility] has not received refill request for MTX. She called them and they are requesting that our office call the prescription in, since they are not receiving it via fax. Patient is out of medication.  Phone # [phone] Fax# [phone]

## 2017-04-05 ENCOUNTER — Telehealth: Payer: Self-pay | Admitting: Rheumatology

## 2017-04-05 MED ORDER — METHOTREXATE 2.5 MG PO TABS: ORAL_TABLET | ORAL | 0 refills | Status: DC

## 2017-04-05 NOTE — Telephone Encounter (Signed)
Patient calling in reference to MTX. Patient needs a refill, and mail order is not getting rx for some reason. Patient is completely out at this point. Requesting a 90 day supply to be called into [facility] in [place] on [address].

## 2017-04-05 NOTE — Telephone Encounter (Signed)
Spoke with Misty Stanley at [facility] and she states they have not received the last 5 prescriptions that we have sent to them to refill the MTX. Sent the prescription to local pharmacy as requested by patient and left message to advise patient.

## 2017-04-05 NOTE — Telephone Encounter (Signed)
Attempted to contact the patient and left message for patient to call the office.  

## 2017-04-10 ENCOUNTER — Telehealth: Payer: Self-pay | Admitting: *Deleted

## 2017-04-10 NOTE — Telephone Encounter (Signed)
CMP on 12/28/16 WNL

## 2017-04-27 NOTE — Progress Notes (Signed)
#  6:Schedule patient for ultrasound of bilateral hands and wrist at the next available; synovial thickening of bilateral first and second MCP joint; rule out synovitis and inadequate response to methotrexate and Plaquenil treatment

## 2017-05-01 ENCOUNTER — Other Ambulatory Visit: Payer: Self-pay | Admitting: *Deleted

## 2017-05-01 NOTE — Telephone Encounter (Signed)
Refill request received via fax.   Last Visit: 02/09/17 Next Visit: 07/17/17 UDS: 04/24/16 Narc agreement: 04/19/16  Patient coming to office 05/02/17, will update UDS and Narcotic Agreement.  Okay to refill Tramadol?

## 2017-05-02 ENCOUNTER — Ambulatory Visit (INDEPENDENT_AMBULATORY_CARE_PROVIDER_SITE_OTHER): Payer: Medicare Other | Admitting: Rheumatology

## 2017-05-02 ENCOUNTER — Inpatient Hospital Stay (INDEPENDENT_AMBULATORY_CARE_PROVIDER_SITE_OTHER): Payer: Self-pay

## 2017-05-02 DIAGNOSIS — M79642 Pain in left hand: Secondary | ICD-10-CM | POA: Diagnosis not present

## 2017-05-02 DIAGNOSIS — Z5181 Encounter for therapeutic drug level monitoring: Secondary | ICD-10-CM

## 2017-05-02 DIAGNOSIS — M79641 Pain in right hand: Secondary | ICD-10-CM

## 2017-05-02 MED ORDER — TRAMADOL HCL 50 MG PO TABS: ORAL_TABLET | ORAL | 0 refills | Status: DC

## 2017-05-02 NOTE — Telephone Encounter (Signed)
ok 

## 2017-05-02 NOTE — Addendum Note (Signed)
Addended by: Henriette Combs on: 05/02/2017 03:27 PM   Modules accepted: Orders

## 2017-05-08 LAB — PAIN MGMT, PROFILE 5 W/CONF, U
Amphetamines: NEGATIVE ng/mL (ref <500)
BARBITURATES: NEGATIVE ng/mL (ref <300)
BENZODIAZEPINES: NEGATIVE ng/mL (ref <100)
Cocaine Metabolite: NEGATIVE ng/mL (ref <150)
Creatinine: 31.3 mg/dL
MARIJUANA METABOLITE: NEGATIVE ng/mL (ref <20)
METHADONE METABOLITE: NEGATIVE ng/mL (ref <100)
OPIATES: NEGATIVE ng/mL (ref <100)
OXIDANT: NEGATIVE ug/mL (ref <200)
Oxycodone: NEGATIVE ng/mL (ref <100)
pH: 6.73 (ref 4.5–9.0)

## 2017-05-08 LAB — PAIN MGMT, TRAMADOL W/MEDMATCH, U
Desmethyltramadol: 4408 ng/mL — ABNORMAL HIGH (ref <100)
TRAMADOL: 25565 ng/mL — AB (ref <100)

## 2017-05-08 NOTE — Progress Notes (Signed)
C/w

## 2017-05-25 ENCOUNTER — Other Ambulatory Visit: Payer: Self-pay | Admitting: Rheumatology

## 2017-05-25 NOTE — Telephone Encounter (Signed)
Last Visit: 02/09/17 Next Visit: 07/17/17  Okay to refill per Dr. Corliss Skains

## 2017-06-03 ENCOUNTER — Other Ambulatory Visit: Payer: Self-pay | Admitting: Rheumatology

## 2017-06-04 MED ORDER — METHOTREXATE 2.5 MG PO TABS: ORAL_TABLET | ORAL | 0 refills | Status: DC

## 2017-06-04 NOTE — Telephone Encounter (Addendum)
Last Visit: 02/09/17 Next Visit: 07/17/17 Labs: 02/09/17 CBC mild anemia  PLQ Eye Exam: 04/2017 WNL Patient will have copy faxed to office.   Patient states she also needs a refill on MTX. Patient advised she needs a refill on MTX and advised she needs labs   Okay to refill per Dr. Corliss Skainseveshwar

## 2017-06-07 ENCOUNTER — Other Ambulatory Visit: Payer: Self-pay | Admitting: *Deleted

## 2017-06-07 DIAGNOSIS — Z79899 Other long term (current) drug therapy: Secondary | ICD-10-CM

## 2017-06-07 LAB — CBC WITH DIFFERENTIAL/PLATELET
BASOS PCT: 1 %
Basophils Absolute: 41 cells/uL (ref 0–200)
EOS PCT: 2 %
Eosinophils Absolute: 82 cells/uL (ref 15–500)
HCT: 34 % — ABNORMAL LOW (ref 35.0–45.0)
Hemoglobin: 11.6 g/dL — ABNORMAL LOW (ref 11.7–15.5)
LYMPHS ABS: 1111 {cells}/uL (ref 850–3900)
MCH: 30.2 pg (ref 27.0–33.0)
MCHC: 34.1 g/dL (ref 32.0–36.0)
MCV: 88.5 fL (ref 80.0–100.0)
MPV: 10.4 fL (ref 7.5–12.5)
Monocytes Relative: 12 %
Neutro Abs: 2374 cells/uL (ref 1500–7800)
Neutrophils Relative %: 57.9 %
Platelets: 263 10*3/uL (ref 140–400)
RBC: 3.84 10*6/uL (ref 3.80–5.10)
RDW: 13.9 % (ref 11.0–15.0)
TOTAL LYMPHOCYTE: 27.1 %
WBC: 4.1 10*3/uL (ref 3.8–10.8)
WBCMIX: 492 {cells}/uL (ref 200–950)

## 2017-06-07 LAB — COMPLETE METABOLIC PANEL WITH GFR
AG Ratio: 1.7 (calc) (ref 1.0–2.5)
ALBUMIN MSPROF: 4.4 g/dL (ref 3.6–5.1)
ALKALINE PHOSPHATASE (APISO): 53 U/L (ref 33–130)
ALT: 24 U/L (ref 6–29)
AST: 30 U/L (ref 10–35)
BILIRUBIN TOTAL: 0.4 mg/dL (ref 0.2–1.2)
BUN: 8 mg/dL (ref 7–25)
CHLORIDE: 92 mmol/L — AB (ref 98–110)
CO2: 32 mmol/L (ref 20–32)
Calcium: 9.6 mg/dL (ref 8.6–10.4)
Creat: 0.59 mg/dL (ref 0.50–1.05)
GFR, Est African American: 117 mL/min/{1.73_m2} (ref >=60)
GFR, Est Non African American: 101 mL/min/{1.73_m2} (ref >=60)
GLOBULIN: 2.6 g/dL (ref 1.9–3.7)
GLUCOSE: 110 mg/dL — AB (ref 65–99)
Potassium: 4.6 mmol/L (ref 3.5–5.3)
SODIUM: 130 mmol/L — AB (ref 135–146)
Total Protein: 7 g/dL (ref 6.1–8.1)

## 2017-06-08 NOTE — Progress Notes (Signed)
Labs are stable.

## 2017-06-15 ENCOUNTER — Telehealth: Payer: Self-pay

## 2017-06-15 MED ORDER — METHOTREXATE 2.5 MG PO TABS: ORAL_TABLET | ORAL | 0 refills | Status: DC

## 2017-06-15 NOTE — Telephone Encounter (Signed)
Patient would like a Rx refill on MTX sent to [facility]. CB# [phone].  Please advise.  Thank You.

## 2017-06-15 NOTE — Addendum Note (Signed)
Addended by: Henriette CombsHATTON, Hester Forget L on: 06/15/2017 11:45 AM   Modules accepted: Orders

## 2017-06-15 NOTE — Telephone Encounter (Signed)
Last Visit: 02/09/17 Next Visit: 07/17/17 Labs: 06/07/17 stable    Okay to refill per Dr. Corliss Skainseveshwar

## 2017-06-20 ENCOUNTER — Telehealth: Payer: Self-pay

## 2017-06-20 NOTE — Telephone Encounter (Signed)
Patient advised prescription was sent in on 06/15/17. Patient will contact pharmacy.

## 2017-06-20 NOTE — Telephone Encounter (Signed)
Patient would like a Rx refill on MTX sent ot [facility].  Cb# [phone].  Please advise.  Thank You.

## 2017-06-26 ENCOUNTER — Telehealth (INDEPENDENT_AMBULATORY_CARE_PROVIDER_SITE_OTHER): Payer: Self-pay

## 2017-06-26 NOTE — Telephone Encounter (Signed)
[facility] [place]  Cb# is [phone].  Please advise.  Thank You.

## 2017-06-27 MED ORDER — METHOTREXATE 2.5 MG PO TABS: ORAL_TABLET | ORAL | 0 refills | Status: DC

## 2017-06-27 NOTE — Telephone Encounter (Signed)
Left message for patient to call the office

## 2017-06-27 NOTE — Telephone Encounter (Signed)
Prescription sent to local pharmacy per patient's request. [facility] did not receive previous prescription, per patient. Patient notified.

## 2017-06-27 NOTE — Addendum Note (Signed)
Addended by: Ellen HenriGRAVES, Iqra Rotundo C on: 06/27/2017 02:44 PM   Modules accepted: Orders

## 2017-07-04 NOTE — Progress Notes (Signed)
Office Visit Note  Patient: Mary Lane             Date of Birth: 11/07/1960           MRN: [id number]             PCP: Lynn ItoSmith, Lori, MD Referring: Lynn ItoSmith, Lori, MD Visit Date: 07/17/2017 Occupation: @GUAROCC @    Subjective:  bilateral hands, wrists, and feet pain   History of Present Illness: Mary Lane is a 56 y.o. female with history of seropositive rheumatoid arthritis, osteoarthritis, fibromyalgia, and disc disease.  Patient states she has been experiencing bilateral swelling and pain in her hands, wrists, feet, and ankles.  She states she continues to take 6 tablets of methotrexate weekly as well as twice daily Monday through Friday.  She reports her eye exam is up to date as she had it in September 2018.  She has noticed decreased grip strength and increased stiffness.  She has been experiencing increased symptoms of Raynaud's since its been cold so she has been wearing gloves and thicker socks.  She has not noticed any digital ulcers.    She reports she has been having neck pain with full ROM.  She states the neck pain has been causing tension headaches.  She has some muscle tightness of her trapezius muscles.  She states she has been having more generalized pain due to her fibromyalgia.  Her fatigue and insomnia are unchanged.  She continues to take Ambien, Robaxin, and Flexeril.  She reports her lower back pain is also unchanged.     Patient states that recently she has been having more frequent mouth sores.  She denies any nasal ulcers but does have some tenderness in her nostrils.     Activities of Daily Living:  Patient reports morning stiffness for 2-3 hours.   Patient Reports nocturnal pain.  Difficulty dressing/grooming: Denies Difficulty climbing stairs: Denies Difficulty getting out of chair: Denies Difficulty using hands for taps, buttons, cutlery, and/or writing: Reports   Review of Systems  Constitutional: Positive for fatigue. Negative for weakness.  HENT:  Positive for mouth sores and mouth dryness. Negative for nose dryness.   Eyes: Positive for dryness. Negative for redness.  Respiratory: Negative for cough, hemoptysis, shortness of breath and difficulty breathing.   Cardiovascular: Negative.  Negative for chest pain, palpitations, hypertension, irregular heartbeat and swelling in legs/feet.  Gastrointestinal: Negative.  Negative for blood in stool, constipation and diarrhea.  Endocrine: Negative for increased urination.  Genitourinary: Negative for painful urination.  Musculoskeletal: Positive for arthralgias, joint pain, joint swelling, myalgias, muscle weakness, morning stiffness, muscle tenderness and myalgias.  Skin: Negative for color change (Raynaud's), pallor, rash, hair loss, nodules/bumps, redness, skin tightness, ulcers and sensitivity to sunlight.  Neurological: Negative for dizziness, numbness and headaches.  Hematological: Negative for swollen glands.  Psychiatric/Behavioral: Positive for depressed mood and sleep disturbance. The patient is not nervous/anxious.     PMFS History:  Patient Active Problem List   Diagnosis Date Noted  . High risk medication use 09/06/2016    History reviewed. No pertinent past medical history.  Family History  Problem Relation Age of Onset  . Hypertension Mother   . Prostate cancer Father   . Hypertension Father   . Hypertension Sister   . Hypercholesterolemia Sister   . Bipolar disorder Sister   . Depression Sister   . Healthy Daughter   . Healthy Son    Past Surgical History:  Procedure Laterality Date  . ABDOMINAL HYSTERECTOMY    .  APPENDECTOMY    . CARPAL TUNNEL RELEASE Bilateral   . CHOLECYSTECTOMY    . ERCP    . KNEE SURGERY Bilateral   . TUBAL LIGATION     Social History   Social History Narrative  . Not on file     Objective: Vital Signs: BP 115/70 (BP Location: Left Arm, Patient Position: Sitting, Cuff Size: Normal)   Pulse 71   Resp 14   Ht 5' 4.8" (1.646 m)    Wt 128 lb (58.1 kg)   BMI 21.43 kg/m    Physical Exam  Constitutional: She is oriented to person, place, and time. She appears well-developed and well-nourished.  HENT:  Head: Normocephalic and atraumatic.  Eyes: Conjunctivae and EOM are normal.  Neck: Normal range of motion.  Cardiovascular: Normal rate, regular rhythm, normal heart sounds and intact distal pulses.  Pulmonary/Chest: Effort normal and breath sounds normal.  Abdominal: Soft. Bowel sounds are normal.  Lymphadenopathy:    She has no cervical adenopathy.  Neurological: She is alert and oriented to person, place, and time.  Skin: Skin is warm and dry. Capillary refill takes less than 2 seconds.  Psychiatric: She has a normal mood and affect. Her behavior is normal.  Nursing note and vitals reviewed.    Musculoskeletal Exam: C-spine good ROM with mild discomfort.  Thoracic spine and lumbar spine good range of motion.  Shoulder joints, elbow joints, wrist joints good ROM with no synovitis. MCPs, PIPs, and DIPs good ROM with no synovitis.  PIP and DIP synovial thickening consistent with osteoarthritis. Hip joints and knee joints good ROM.  Bilateral ankles mild effusion, no warmth, good ROM.  MTPs, PIPs, and DIPs good ROM with no synovitis.     CDAI Exam: No CDAI exam completed.    Investigation: No additional findings.PLQ eye exam: 04/2017 per patient UDS: 05/01/2017 Narc agreement: 05/01/2017 CBC Latest Ref Rng & Units 06/07/2017 02/09/2017 08/28/2016  WBC 3.8 - 10.8 Thousand/uL 4.1 5.3 5.8  Hemoglobin 11.7 - 15.5 g/dL 11.6(L) 11.2(L) 11.7  Hematocrit 35.0 - 45.0 % 34.0(L) 34.4(L) 34.8  Platelets 140 - 400 Thousand/uL 263 410(H) 284   CMP Latest Ref Rng & Units 06/07/2017 08/28/2016  Glucose 65 - 99 mg/dL 914(N) 93  BUN 7 - 25 mg/dL 8 8  Creatinine 8.29 - 1.05 mg/dL 5.62 1.30  Sodium 865 - 146 mmol/L 130(L) 128(L)  Potassium 3.5 - 5.3 mmol/L 4.6 4.4  Chloride 98 - 110 mmol/L 92(L) 87(L)  CO2 20 - 32 mmol/L 32 26    Calcium 8.6 - 10.4 mg/dL 9.6 9.8  Total Protein 6.1 - 8.1 g/dL 7.0 7.4  Total Bilirubin 0.2 - 1.2 mg/dL 0.4 0.4  Alkaline Phos 39 - 117 IU/L - 52  AST 10 - 35 U/L 30 29  ALT 6 - 29 U/L 24 26    Imaging: No results found.  Speciality Comments: No specialty comments available.    Procedures:  No procedures performed Allergies: Baclofen; Gabapentin; Ivp dye [iodinated diagnostic agents]; and Sulfa antibiotics   Assessment / Plan:     Visit Diagnoses: Rheumatoid arthritis involving multiple sites with positive rheumatoid factor (HCC) - +RF, +ANA: Patient has no synovitis on exam.  She is taking Methotrexate 6 tablets weekly and folic 2 mg daily and Plaquenil 200 mg BID Monday-Friday.  She has been having increased frequency of mouth sores, which may or may not be related to Methotrexate.  Patient's labs are up to date.    Raynaud's disease without gangrene: continues  to have symptoms.  Wears gloves and thick socks.  No ulcers or gangrene on exam.    High risk medication use - MTX. PLQ, folic acid.  Patient's plaquenil eye exam was in September 2018 and was normal.  Patient is due for routine lab monitoring in January and every 3 months following. CMP and CBC standing orders placed.    Primary osteoarthritis of both hands: PIP and DIP synovial thickening on exam.  No synovitis.    Primary osteoarthritis of both knees: Denies any pain or swelling currently.    DDD (degenerative disc disease), lumbar - s/p fusion: Unchanged.     Fibromyalgia: Generalized pain.  She has continued insomnia and fatigue.  She has not been exercising regularly.  Discussed the importance of exercise and good sleep hygiene.   Primary insomnia: Continues to have insomnia.  She takes Ambien.    Mouth sores - Discussed that mouth sores can be a side effect of Methotrexate.  A magic mouthwash prescription was sent.  If her mouth sores worsen or do not resolve she can call back.  May have to discuss decreasing  Methotrexate dose if so.  Plan: magic mouthwash w/lidocaine SOLN   Neck pain: She has good ROM with mild discomfort.  Tenderness over trapezius muscle.  Patient given a handout for neck exercises.    Other medical conditions are listed as follows:   History of hypertension  History of hypercholesterolemia  History of gastroesophageal reflux (GERD)  History of depression  History of vitamin D deficiency  History of cholecystectomy  History of appendectomy     Orders: Orders Placed This Encounter  Procedures  . CBC with Differential/Platelet  . COMPLETE METABOLIC PANEL WITH GFR   Meds ordered this encounter  Medications  . magic mouthwash w/lidocaine SOLN    Sig: Take 5 mLs by mouth 3 (three) times daily as needed for mouth pain. Swish and spit solution.    Dispense:  250 mL    Refill:  0    Face-to-face time spent with patient was 30 minutes. Greater than 50% of time was spent in counseling and coordination of care.  Follow-Up Instructions: Return in about 5 months (around 12/15/2017) for Rheumatoid arthritis, Osteoarthritis.  Pollyann SavoyShaili Cherry [place], MD Note - This record has been created using [employer] software.  Chart creation errors have been sought, but may not always  have been located. Such creation errors do not reflect on  the standard of medical care.

## 2017-07-17 ENCOUNTER — Ambulatory Visit (INDEPENDENT_AMBULATORY_CARE_PROVIDER_SITE_OTHER): Payer: Medicare Other | Admitting: Rheumatology

## 2017-07-17 ENCOUNTER — Encounter: Payer: Self-pay | Admitting: Rheumatology

## 2017-07-17 VITALS — BP 115/70 | HR 71 | Resp 14 | Ht 64.8 in | Wt 128.0 lb

## 2017-07-17 DIAGNOSIS — M170 Bilateral primary osteoarthritis of knee: Secondary | ICD-10-CM

## 2017-07-17 DIAGNOSIS — Z8719 Personal history of other diseases of the digestive system: Secondary | ICD-10-CM

## 2017-07-17 DIAGNOSIS — M797 Fibromyalgia: Secondary | ICD-10-CM | POA: Diagnosis not present

## 2017-07-17 DIAGNOSIS — M19042 Primary osteoarthritis, left hand: Secondary | ICD-10-CM

## 2017-07-17 DIAGNOSIS — F5101 Primary insomnia: Secondary | ICD-10-CM | POA: Diagnosis not present

## 2017-07-17 DIAGNOSIS — Z8659 Personal history of other mental and behavioral disorders: Secondary | ICD-10-CM | POA: Diagnosis not present

## 2017-07-17 DIAGNOSIS — Z8679 Personal history of other diseases of the circulatory system: Secondary | ICD-10-CM | POA: Diagnosis not present

## 2017-07-17 DIAGNOSIS — Z79899 Other long term (current) drug therapy: Secondary | ICD-10-CM

## 2017-07-17 DIAGNOSIS — M0579 Rheumatoid arthritis with rheumatoid factor of multiple sites without organ or systems involvement: Secondary | ICD-10-CM

## 2017-07-17 DIAGNOSIS — K1379 Other lesions of oral mucosa: Secondary | ICD-10-CM

## 2017-07-17 DIAGNOSIS — M5136 Other intervertebral disc degeneration, lumbar region: Secondary | ICD-10-CM

## 2017-07-17 DIAGNOSIS — I7300 Raynaud's syndrome without gangrene: Secondary | ICD-10-CM | POA: Diagnosis not present

## 2017-07-17 DIAGNOSIS — Z8639 Personal history of other endocrine, nutritional and metabolic disease: Secondary | ICD-10-CM | POA: Diagnosis not present

## 2017-07-17 DIAGNOSIS — Z9049 Acquired absence of other specified parts of digestive tract: Secondary | ICD-10-CM

## 2017-07-17 DIAGNOSIS — M19041 Primary osteoarthritis, right hand: Secondary | ICD-10-CM | POA: Diagnosis not present

## 2017-07-17 DIAGNOSIS — M542 Cervicalgia: Secondary | ICD-10-CM

## 2017-07-17 MED ORDER — MAGIC MOUTHWASH W/LIDOCAINE: 5.0000 mL | Freq: Three times a day (TID) | ORAL | 0 refills | Status: DC | PRN

## 2017-07-17 NOTE — Patient Instructions (Signed)
Neck Exercises Neck exercises can be important for many reasons:  They can help you to improve and maintain flexibility in your neck. This can be especially important as you age.  They can help to make your neck stronger. This can make movement easier.  They can reduce or prevent neck pain.  They may help your upper back.  Ask your health care provider which neck exercises would be best for you. Exercises Neck Press Repeat this exercise 10 times. Do it first thing in the morning and right before bed or as told by your health care provider. 1. Lie on your back on a firm bed or on the floor with a pillow under your head. 2. Use your neck muscles to push your head down on the pillow and straighten your spine. 3. Hold the position as well as you can. Keep your head facing up and your chin tucked. 4. Slowly count to 5 while holding this position. 5. Relax for a few seconds. Then repeat.  Isometric Strengthening Do a full set of these exercises 2 times a day or as told by your health care provider. 1. Sit in a supportive chair and place your hand on your forehead. 2. Push forward with your head and neck while pushing back with your hand. Hold for 10 seconds. 3. Relax. Then repeat the exercise 3 times. 4. Next, do thesequence again, this time putting your hand against the back of your head. Use your head and neck to push backward against the hand pressure. 5. Finally, do the same exercise on either side of your head, pushing sideways against the pressure of your hand.  Prone Head Lifts Repeat this exercise 5 times. Do this 2 times a day or as told by your health care provider. 1. Lie face-down, resting on your elbows so that your chest and upper back are raised. 2. Start with your head facing downward, near your chest. Position your chin either on or near your chest. 3. Slowly lift your head upward. Lift until you are looking straight ahead. Then continue lifting your head as far back as  you can stretch. 4. Hold your head up for 5 seconds. Then slowly lower it to your starting position.  Supine Head Lifts Repeat this exercise 8-10 times. Do this 2 times a day or as told by your health care provider. 1. Lie on your back, bending your knees to point to the ceiling and keeping your feet flat on the floor. 2. Lift your head slowly off the floor, raising your chin toward your chest. 3. Hold for 5 seconds. 4. Relax and repeat.  Scapular Retraction Repeat this exercise 5 times. Do this 2 times a day or as told by your health care provider. 1. Stand with your arms at your sides. Look straight ahead. 2. Slowly pull both shoulders backward and downward until you feel a stretch between your shoulder blades in your upper back. 3. Hold for 10-30 seconds. 4. Relax and repeat.  Contact a health care provider if:  Your neck pain or discomfort gets much worse when you do an exercise.  Your neck pain or discomfort does not improve within 2 hours after you exercise. If you have any of these problems, stop exercising right away. Do not do the exercises again unless your health care provider says that you can. Get help right away if:  You develop sudden, severe neck pain. If this happens, stop exercising right away. Do not do the exercises again unless your   health care provider says that you can. Exercises Neck Stretch  Repeat this exercise 3-5 times. 1. Do this exercise while standing or while sitting in a chair. 2. Place your feet flat on the floor, shoulder-width apart. 3. Slowly turn your head to the right. Turn it all the way to the right so you can look over your right shoulder. Do not tilt or tip your head. 4. Hold this position for 10-30 seconds. 5. Slowly turn your head to the left, to look over your left shoulder. 6. Hold this position for 10-30 seconds.  Neck Retraction Repeat this exercise 8-10 times. Do this 3-4 times a day or as told by your health care  provider. 1. Do this exercise while standing or while sitting in a sturdy chair. 2. Look straight ahead. Do not bend your neck. 3. Use your fingers to push your chin backward. Do not bend your neck for this movement. Continue to face straight ahead. If you are doing the exercise properly, you will feel a slight sensation in your throat and a stretch at the back of your neck. 4. Hold the stretch for 1-2 seconds. Relax and repeat.  This information is not intended to replace advice given to you by your health care provider. Make sure you discuss any questions you have with your health care provider. Document Released: 07/12/2015 Document Revised: 01/06/2016 Document Reviewed: 02/08/2015 Elsevier Interactive Patient Education  2017 [employer]  

## 2017-07-18 ENCOUNTER — Telehealth: Payer: Self-pay | Admitting: Rheumatology

## 2017-07-18 NOTE — Telephone Encounter (Signed)
Patient calling in reference for Magic Mouthwash. Needs to know ingredients, and quantity. Please call to advise.

## 2017-07-18 NOTE — Telephone Encounter (Signed)
1 part diphenhydramine 12.5 mg per 5 ml  1 part maalox 1 part viscous lidocaine 1%

## 2017-07-19 NOTE — Telephone Encounter (Signed)
Spoke with ChadKong at the pharmacy and verified the ingredients of the [employer]wash. They are getting the prescription ready.

## 2017-07-30 ENCOUNTER — Other Ambulatory Visit: Payer: Self-pay | Admitting: *Deleted

## 2017-07-30 MED ORDER — TRAMADOL HCL 50 MG PO TABS: ORAL_TABLET | ORAL | 0 refills | Status: DC

## 2017-07-30 NOTE — Telephone Encounter (Signed)
Ok

## 2017-07-30 NOTE — Telephone Encounter (Signed)
Last Visit: 07/17/17 Next Visit: 12/18/17 UDS: 05/02/17 Narcotic Agreement: 05/01/17  Okay to refill Tramadol?

## 2017-08-28 ENCOUNTER — Other Ambulatory Visit: Payer: Self-pay | Admitting: Rheumatology

## 2017-08-29 NOTE — Telephone Encounter (Signed)
Last Visit: 07/17/17 Next Visit: 12/18/17  Okay to refill per Dr. Corliss Skainseveshwar

## 2017-08-29 NOTE — Telephone Encounter (Signed)
Last Visit: 07/17/17 Next Visit: 12/18/17

## 2017-09-13 ENCOUNTER — Other Ambulatory Visit: Payer: Self-pay | Admitting: Rheumatology

## 2017-09-13 NOTE — Telephone Encounter (Signed)
Patient needs a refill on MTX 3 month supply sent to [facility] in [place] on [address].

## 2017-09-13 NOTE — Telephone Encounter (Signed)
Last Visit: 07/17/17 Next Visit: 12/18/17 Labs: 06/07/17 Stable  Patient will update lab 09/14/17. Will send prescription after patient comes in for labs.

## 2017-09-14 ENCOUNTER — Other Ambulatory Visit: Payer: Self-pay

## 2017-09-14 DIAGNOSIS — Z79899 Other long term (current) drug therapy: Secondary | ICD-10-CM

## 2017-09-14 LAB — COMPLETE METABOLIC PANEL WITH GFR
AG Ratio: 1.7 (calc) (ref 1.0–2.5)
ALT: 22 U/L (ref 6–29)
AST: 24 U/L (ref 10–35)
Albumin: 4.2 g/dL (ref 3.6–5.1)
Alkaline phosphatase (APISO): 53 U/L (ref 33–130)
BILIRUBIN TOTAL: 0.4 mg/dL (ref 0.2–1.2)
BUN/Creatinine Ratio: 10 (calc) (ref 6–22)
BUN: 6 mg/dL — AB (ref 7–25)
CHLORIDE: 92 mmol/L — AB (ref 98–110)
CO2: 31 mmol/L (ref 20–32)
CREATININE: 0.61 mg/dL (ref 0.50–1.05)
Calcium: 9.8 mg/dL (ref 8.6–10.4)
GFR, Est African American: 116 mL/min/{1.73_m2} (ref >=60)
GFR, Est Non African American: 100 mL/min/{1.73_m2} (ref >=60)
GLUCOSE: 90 mg/dL (ref 65–99)
Globulin: 2.5 g/dL (calc) (ref 1.9–3.7)
Potassium: 3.7 mmol/L (ref 3.5–5.3)
Sodium: 130 mmol/L — ABNORMAL LOW (ref 135–146)
Total Protein: 6.7 g/dL (ref 6.1–8.1)

## 2017-09-14 LAB — CBC WITH DIFFERENTIAL/PLATELET
BASOS PCT: 0.7 %
Basophils Absolute: 32 cells/uL (ref 0–200)
EOS PCT: 1.6 %
Eosinophils Absolute: 72 cells/uL (ref 15–500)
HEMATOCRIT: 33.4 % — AB (ref 35.0–45.0)
Hemoglobin: 11.7 g/dL (ref 11.7–15.5)
LYMPHS ABS: 1431 {cells}/uL (ref 850–3900)
MCH: 31 pg (ref 27.0–33.0)
MCHC: 35 g/dL (ref 32.0–36.0)
MCV: 88.6 fL (ref 80.0–100.0)
MPV: 10.5 fL (ref 7.5–12.5)
Monocytes Relative: 7.8 %
Neutro Abs: 2615 cells/uL (ref 1500–7800)
Neutrophils Relative %: 58.1 %
PLATELETS: 262 10*3/uL (ref 140–400)
RBC: 3.77 10*6/uL — AB (ref 3.80–5.10)
RDW: 13.2 % (ref 11.0–15.0)
Total Lymphocyte: 31.8 %
WBC: 4.5 10*3/uL (ref 3.8–10.8)
WBCMIX: 351 {cells}/uL (ref 200–950)

## 2017-09-14 MED ORDER — METHOTREXATE 2.5 MG PO TABS: ORAL_TABLET | ORAL | 0 refills | Status: DC

## 2017-09-14 NOTE — Telephone Encounter (Signed)
Patient came in for labs. Prescriptions sent to pharmacy.

## 2017-09-18 NOTE — Progress Notes (Signed)
Sodium is stable but continues to be low.  Please notify patient and fax results to PCP.

## 2017-09-25 DIAGNOSIS — Z8639 Personal history of other endocrine, nutritional and metabolic disease: Secondary | ICD-10-CM | POA: Insufficient documentation

## 2017-09-25 DIAGNOSIS — I10 Essential (primary) hypertension: Secondary | ICD-10-CM | POA: Insufficient documentation

## 2017-09-25 DIAGNOSIS — M170 Bilateral primary osteoarthritis of knee: Secondary | ICD-10-CM | POA: Insufficient documentation

## 2017-09-25 DIAGNOSIS — M19041 Primary osteoarthritis, right hand: Secondary | ICD-10-CM | POA: Insufficient documentation

## 2017-09-25 DIAGNOSIS — I7300 Raynaud's syndrome without gangrene: Secondary | ICD-10-CM | POA: Insufficient documentation

## 2017-09-25 DIAGNOSIS — K1379 Other lesions of oral mucosa: Secondary | ICD-10-CM | POA: Insufficient documentation

## 2017-09-25 DIAGNOSIS — M5136 Other intervertebral disc degeneration, lumbar region: Secondary | ICD-10-CM | POA: Insufficient documentation

## 2017-09-25 DIAGNOSIS — M797 Fibromyalgia: Secondary | ICD-10-CM | POA: Insufficient documentation

## 2017-09-25 DIAGNOSIS — Z5181 Encounter for therapeutic drug level monitoring: Secondary | ICD-10-CM | POA: Insufficient documentation

## 2017-09-25 DIAGNOSIS — F5101 Primary insomnia: Secondary | ICD-10-CM | POA: Insufficient documentation

## 2017-09-25 DIAGNOSIS — E559 Vitamin D deficiency, unspecified: Secondary | ICD-10-CM | POA: Insufficient documentation

## 2017-09-25 DIAGNOSIS — M19042 Primary osteoarthritis, left hand: Secondary | ICD-10-CM

## 2017-09-25 DIAGNOSIS — Z8719 Personal history of other diseases of the digestive system: Secondary | ICD-10-CM | POA: Insufficient documentation

## 2017-09-25 DIAGNOSIS — M51369 Other intervertebral disc degeneration, lumbar region without mention of lumbar back pain or lower extremity pain: Secondary | ICD-10-CM | POA: Insufficient documentation

## 2017-09-25 DIAGNOSIS — M0579 Rheumatoid arthritis with rheumatoid factor of multiple sites without organ or systems involvement: Secondary | ICD-10-CM | POA: Insufficient documentation

## 2017-09-25 DIAGNOSIS — Z8659 Personal history of other mental and behavioral disorders: Secondary | ICD-10-CM | POA: Insufficient documentation

## 2017-09-25 NOTE — Progress Notes (Signed)
Office Visit Note  Patient: Mary Lane             Date of Birth: 16-Aug-1960           MRN: [id number]             PCP: Lynn Ito, MD Referring: Lynn Ito, MD Visit Date: 09/27/2017 Occupation: @GUAROCC @    Subjective:  Pain and swelling in multiple joints   History of Present Illness: Mary Lane is a 57 y.o. female with history of seropositive rheumatoid arthritis, Raynaud's, osteoarthritis, DDD, and fibromyalgia.  Patient states she continues to take MTX 6 tablets weekly, folic acid 2 mg daily, and PLQ 200 mg BID M-F.  Patient states that starting about 1 month ago she started having increase pain in multiple joints, including her hands, wrists, shoulders, hips, knees, feet, and ankles.  She states she has swelling in her bilateral hands.  She states that she wears compression gloves at night and they have been too tight to wear.  She states that she has not missed any doses of her medication and she denies any changes in activity to trigger a flare.  She attributes some of her pain due to weather changes.  She has morning stiffness in her joints and also some stiffness in the afternoon.  She has been more stationary over the past month, so she feels more stiff, especially in the lumbar region.  She has been taking Aleve and Tylenol every day for the past month for pain relief.  She denies any muscle tenderness or muscle weakness.  Her fibromyalgia has been well controlled.  She has had decreased grip strength and fine motor movements.  She continues to have dry mouth and dry eyes.  She has occasional mouth sores as well.  She uses magic mouthwash.  She has Raynaud's primarily in her hands.    Activities of Daily Living:  Patient reports morning stiffness for 30  minutes.   Patient Reports nocturnal pain.  Difficulty dressing/grooming: Denies Difficulty climbing stairs: Reports Difficulty getting out of chair: Reports Difficulty using hands for taps, buttons, cutlery, and/or  writing: Reports   Review of Systems  Constitutional: Positive for fatigue. Negative for weakness.  HENT: Positive for mouth sores and mouth dryness. Negative for nose dryness.   Eyes: Positive for dryness. Negative for pain, redness and visual disturbance.  Respiratory: Negative for cough, hemoptysis, shortness of breath and difficulty breathing.   Cardiovascular: Negative for chest pain, palpitations, hypertension, irregular heartbeat and swelling in legs/feet.  Gastrointestinal: Positive for constipation. Negative for blood in stool and diarrhea.  Endocrine: Negative for increased urination.  Genitourinary: Negative for painful urination.  Musculoskeletal: Positive for arthralgias, joint pain, joint swelling and morning stiffness. Negative for myalgias, muscle weakness, muscle tenderness and myalgias.  Skin: Positive for color change. Negative for pallor, rash, hair loss, nodules/bumps, redness, skin tightness, ulcers and sensitivity to sunlight.  Allergic/Immunologic: Negative for susceptible to infections.  Neurological: Positive for headaches. Negative for dizziness and numbness.  Hematological: Negative for swollen glands.  Psychiatric/Behavioral: Positive for sleep disturbance. Negative for depressed mood. The patient is not nervous/anxious.     PMFS History:  Patient Active Problem List   Diagnosis Date Noted  . Rheumatoid arthritis with rheumatoid factor of multiple sites without organ or systems involvement (HCC) 09/25/2017  . Raynaud's syndrome without gangrene 09/25/2017  . Medication monitoring encounter 09/25/2017  . Primary osteoarthritis of both hands 09/25/2017  . Primary osteoarthritis of both knees 09/25/2017  . DDD (  degenerative disc disease), lumbar 09/25/2017  . Primary insomnia 09/25/2017  . Fibromyalgia 09/25/2017  . Mouth sores 09/25/2017  . Essential hypertension 09/25/2017  . History of gastroesophageal reflux (GERD) 09/25/2017  . History of depression  09/25/2017  . Vitamin D deficiency 09/25/2017  . History of hypercholesterolemia 09/25/2017  . High risk medication use 09/06/2016    History reviewed. No pertinent past medical history.  Family History  Problem Relation Age of Onset  . Hypertension Mother   . Prostate cancer Father   . Hypertension Father   . Hypertension Sister   . Hypercholesterolemia Sister   . Bipolar disorder Sister   . Depression Sister   . Healthy Daughter   . Healthy Son    Past Surgical History:  Procedure Laterality Date  . ABDOMINAL HYSTERECTOMY    . APPENDECTOMY    . CARPAL TUNNEL RELEASE Bilateral   . CHOLECYSTECTOMY    . ERCP    . KNEE SURGERY Bilateral   . TUBAL LIGATION     Social History   Social History Narrative  . Not on file     Objective: Vital Signs: BP 116/72 (BP Location: Left Arm, Patient Position: Sitting, Cuff Size: Normal)   Pulse 77   Resp 15   Ht 5' 4.75" (1.645 m)   Wt 129 lb 8 oz (58.7 kg)   BMI 21.72 kg/m    Physical Exam  Constitutional: She is oriented to person, place, and time. She appears well-developed and well-nourished.  HENT:  Head: Normocephalic and atraumatic.  Eyes: Conjunctivae and EOM are normal.  Neck: Normal range of motion.  Cardiovascular: Normal rate, regular rhythm, normal heart sounds and intact distal pulses.  Pulmonary/Chest: Effort normal and breath sounds normal.  Abdominal: Soft. Bowel sounds are normal.  Lymphadenopathy:    She has no cervical adenopathy.  Neurological: She is alert and oriented to person, place, and time.  Skin: Skin is warm and dry. Capillary refill takes less than 2 seconds.  No digital ulcers present on exam.  Psychiatric: She has a normal mood and affect. Her behavior is normal.  Nursing note and vitals reviewed.    Musculoskeletal Exam: C-spine, thoracic, and lumbar spine good ROM.  No midline spinal tenderness.  Mild SI joint tenderness.  Shoulder joints, elbow joints, wrist joints, MCPs, PIPs, and DIPs  good ROM.  She has tenderness of all MCPs and PIPs  She has synovitis of right 3rd and 4th PIP joints and left 2nd MCP.  Hip joints, knee joints, ankle joints, MTPs, PIPs, and DIPs good ROM with no synovitis.  PIP and DIP synovial thickening consistent with osteoarthritis. No warmth or effusion of knees.  She has tenderness of bilateral ankles and all MTPs.  She has mild inflammation of bilateral ankles.  No tenderness of bilateral trochanteric bursa.    CDAI Exam: CDAI Homunculus Exam:   Tenderness:  Right hand: 2nd MCP, 3rd MCP, 4th MCP, 3rd PIP and 4th PIP Left hand: 2nd MCP, 2nd PIP and 3rd PIP  Swelling:  Right hand: 3rd PIP and 4th PIP Left hand: 2nd MCP  Joint Counts:  CDAI Tender Joint count: 8 CDAI Swollen Joint count: 3  Global Assessments:  Patient Global Assessment: 8   CDAI Calculated Score: 19    Investigation: No additional findings. CBC Latest Ref Rng & Units 09/14/2017 06/07/2017 02/09/2017  WBC 3.8 - 10.8 Thousand/uL 4.5 4.1 5.3  Hemoglobin 11.7 - 15.5 g/dL 16.1 11.6(L) 11.2(L)  Hematocrit 35.0 - 45.0 % 33.4(L) 34.0(L) 34.4(L)  Platelets 140 - 400 Thousand/uL 262 263 410(H)   CMP Latest Ref Rng & Units 09/14/2017 06/07/2017 08/28/2016  Glucose 65 - 99 mg/dL 90 161(W) 93  BUN 7 - 25 mg/dL 6(L) 8 8  Creatinine 9.60 - 1.05 mg/dL 4.54 0.98 1.19  Sodium 135 - 146 mmol/L 130(L) 130(L) 128(L)  Potassium 3.5 - 5.3 mmol/L 3.7 4.6 4.4  Chloride 98 - 110 mmol/L 92(L) 92(L) 87(L)  CO2 20 - 32 mmol/L 31 32 26  Calcium 8.6 - 10.4 mg/dL 9.8 9.6 9.8  Total Protein 6.1 - 8.1 g/dL 6.7 7.0 7.4  Total Bilirubin 0.2 - 1.2 mg/dL 0.4 0.4 0.4  Alkaline Phos 39 - 117 IU/L - - 52  AST 10 - 35 U/L 24 30 29   ALT 6 - 29 U/L 22 24 26     Imaging: No results found.  Speciality Comments: No specialty comments available.    Procedures:  No procedures performed Allergies: Baclofen; Gabapentin; Ivp dye [iodinated diagnostic agents]; and Sulfa antibiotics   Assessment / Plan:       Visit Diagnoses: Rheumatoid arthritis with rheumatoid factor of multiple sites without organ or systems involvement [facility]): She has synovitis of right 3rd and 4th PIP joints and left 2nd MCPs.  She has synovial thickening of bilateral 2nd and 3rd MCPs. She will continue on MTX 6 tablets weekly, folic acid 2 mg daily, and PLQ 200 mg BID M-F.  I advised her to limit her use of tylenol and aleve due to her taking it daily for the past month. She was given a short taper of prednisone. Prescription for prednisone 20 mg, taper by 5 mg every 2 days.  She was advised of potential side effects.  She would only like a short taper.  I advised her to call us after completing her taper to update [place] on how she is doing.  She has a follow up appointment in May 2019 with Dr. Corliss Skains.  She can make a sooner appointment with me if she continues to flare.  Medication monitoring encounter - Tramadol-UDS: 05/01/2017, Narc agreement: 05/01/2017. She has been taking Aleve and Tylenol daily, and she was advised to limit the use of these medications.   High risk medication use - MTX 6 tablets weekly, folic acid 2 mg, PLQ 200 mg BID M-F, PLQ eye exam: 04/2017. 09/14/17 CBC and CMP. Repeat CBC and CMP are due in May and every 3 months to monitor for drug toxicity.  Standing orders are in. She does not need any refills of her medications at this time.   Raynaud's syndrome without gangrene: She has no digital ulcers or gangrene on exam. She was advised to wear gloves, thick socks, and to keep her core body temperature warm.   Primary osteoarthritis of both hands: She has PIP and DIP synovial thickening on exam.  Joint protection and muscle strengthening were dicussed.   Primary osteoarthritis of both knees: No warmth or effusion on exam. She is having discomfort in bilateral knees.  DDD (degenerative disc disease), lumbar - s/p fusion: She has limited ROM of her lumbar spine.  No midline spinal tenderness.    Fibromyalgia - She  continues to have fatigue and inosmnia.  She is on Flexeril and Robaxin.  She takes Tramadol for pain relief.  She has been unable to exercise or be active due to her pain in multiple joints.    Primary insomnia: She takes Ambien to help her sleep.   Mouth sores -She uses magic mouthwash or  a benadryl rinse.  She declined a refill at this time. May or may not be related to use of MTX.   Essential hypertension: Well controlled in the office today.  Other medical conditions are listed as follows:   History of hypercholesterolemia  History of gastroesophageal reflux (GERD)  History of depression  Vitamin D deficiency    Orders: No orders of the defined types were placed in this encounter.  Meds ordered this encounter  Medications  . predniSONE (DELTASONE) 5 MG tablet    Sig: Take 4 tablets by mouth for 2 days, then 3 tablets for 2 days, then 2 tablets for 2 days, then 1 tablet for 2 days.    Dispense:  20 tablet    Refill:  0      Follow-Up Instructions: Return for Rheumatoid arthritis, Osteoarthritis, Fibromyalgia.   Gearldine Bienenstockaylor M , PA-C  Note - This record has been created using Dragon software.  Chart creation errors have been sought, but may not always  have been located. Such creation errors do not reflect on  the standard of medical care.

## 2017-09-27 ENCOUNTER — Encounter: Payer: Self-pay | Admitting: Physician Assistant

## 2017-09-27 ENCOUNTER — Ambulatory Visit (INDEPENDENT_AMBULATORY_CARE_PROVIDER_SITE_OTHER): Payer: Medicare Other | Admitting: Physician Assistant

## 2017-09-27 VITALS — BP 116/72 | HR 77 | Resp 15 | Ht 64.75 in | Wt 129.5 lb

## 2017-09-27 DIAGNOSIS — Z8639 Personal history of other endocrine, nutritional and metabolic disease: Secondary | ICD-10-CM | POA: Diagnosis not present

## 2017-09-27 DIAGNOSIS — M5136 Other intervertebral disc degeneration, lumbar region: Secondary | ICD-10-CM | POA: Diagnosis not present

## 2017-09-27 DIAGNOSIS — M0579 Rheumatoid arthritis with rheumatoid factor of multiple sites without organ or systems involvement: Secondary | ICD-10-CM

## 2017-09-27 DIAGNOSIS — K1379 Other lesions of oral mucosa: Secondary | ICD-10-CM

## 2017-09-27 DIAGNOSIS — M19042 Primary osteoarthritis, left hand: Secondary | ICD-10-CM

## 2017-09-27 DIAGNOSIS — Z79899 Other long term (current) drug therapy: Secondary | ICD-10-CM | POA: Diagnosis not present

## 2017-09-27 DIAGNOSIS — I7300 Raynaud's syndrome without gangrene: Secondary | ICD-10-CM

## 2017-09-27 DIAGNOSIS — M797 Fibromyalgia: Secondary | ICD-10-CM | POA: Diagnosis not present

## 2017-09-27 DIAGNOSIS — Z5181 Encounter for therapeutic drug level monitoring: Secondary | ICD-10-CM

## 2017-09-27 DIAGNOSIS — Z8719 Personal history of other diseases of the digestive system: Secondary | ICD-10-CM

## 2017-09-27 DIAGNOSIS — I10 Essential (primary) hypertension: Secondary | ICD-10-CM

## 2017-09-27 DIAGNOSIS — M170 Bilateral primary osteoarthritis of knee: Secondary | ICD-10-CM | POA: Diagnosis not present

## 2017-09-27 DIAGNOSIS — F5101 Primary insomnia: Secondary | ICD-10-CM | POA: Diagnosis not present

## 2017-09-27 DIAGNOSIS — E559 Vitamin D deficiency, unspecified: Secondary | ICD-10-CM

## 2017-09-27 DIAGNOSIS — M19041 Primary osteoarthritis, right hand: Secondary | ICD-10-CM

## 2017-09-27 DIAGNOSIS — Z8659 Personal history of other mental and behavioral disorders: Secondary | ICD-10-CM

## 2017-09-27 MED ORDER — PREDNISONE 5 MG PO TABS
ORAL_TABLET | ORAL | 0 refills | Status: DC
Start: 2017-09-27 — End: 2018-04-01

## 2017-09-27 NOTE — Patient Instructions (Signed)
Standing Labs We placed an order today for your standing lab work.    Please come back and get your standing labs in May and every 3 months  We have open lab Monday through Friday from 8:30-11:30 AM and 1:30-4 PM at the office of Dr. Shaili Deveshwar.   The office is located at [address], [address], [place], [place] [address] No appointment is necessary.   Labs are drawn by [facility].  You may receive a bill from [facility] for your lab work. If you have any questions regarding directions or hours of operation,  please call [phone].    

## 2017-11-19 ENCOUNTER — Other Ambulatory Visit: Payer: Self-pay | Admitting: Rheumatology

## 2017-11-19 NOTE — Telephone Encounter (Signed)
Last visit: 09/27/2017 Next visit: 12/18/2017 Labs: 09/14/2017 Sodium is stable but continues to be low Eye exam: 09/2017  per patient. Patient will have copy faxed over.   Okay to refill 30 day supply per Dr. Corliss Skainseveshwar.

## 2017-11-24 ENCOUNTER — Other Ambulatory Visit: Payer: Self-pay | Admitting: Rheumatology

## 2017-11-26 ENCOUNTER — Other Ambulatory Visit: Payer: Self-pay | Admitting: *Deleted

## 2017-11-26 DIAGNOSIS — Z5181 Encounter for therapeutic drug level monitoring: Secondary | ICD-10-CM

## 2017-11-26 NOTE — Telephone Encounter (Signed)
Received a refill request via fax for Tramadol. Patient advised she will need to update UDS before we can refill. Patient states she plans on coming around 12/12/17. Patient states she has enough medications to last until then.

## 2017-11-26 NOTE — Telephone Encounter (Signed)
Last visit: 09/27/2017 Next visit: 12/18/2017  Okay to refill per Dr. Corliss Skainseveshwar

## 2017-12-04 NOTE — Progress Notes (Signed)
Office Visit Note  Patient: Mary Lane             Date of Birth: [birth year]           MRN: [id number]             PCP: Lynn Ito, MD Referring: Lynn Ito, MD Visit Date: 12/18/2017 Occupation: @GUAROCC @    Subjective:  Increased pain.   History of Present Illness: Mary Lane is a 57 y.o. female story of rheumatoid arthritis, osteoarthritis and fibromyalgia.  She states she has been having increased pain for the last 2 months.  She complains of discomfort in her bilateral wrist, bilateral hands, bilateral hip joints and bilateral knee joints.  He has noticed some swelling in her hands and wrist joints and ankles.  He needs to have discomfort from fibromyalgia as well.  She states she has been taking 1 tramadol in the morning and 2 tramadol at bedtime for pain management.  The lower back pain persist.  Activities of Daily Living:  Patient reports morning stiffness for all day hours.   Patient Reports nocturnal pain.  Difficulty dressing/grooming: Denies Difficulty climbing stairs: Reports Difficulty getting out of chair: Reports Difficulty using hands for taps, buttons, cutlery, and/or writing: Reports   Review of Systems  Constitutional: Positive for fatigue. Negative for night sweats, weight gain and weight loss.  HENT: Positive for mouth dryness. Negative for mouth sores, trouble swallowing, trouble swallowing and nose dryness.   Eyes: Positive for dryness. Negative for pain, redness and visual disturbance.  Respiratory: Negative for cough, shortness of breath and difficulty breathing.   Cardiovascular: Negative for chest pain, palpitations, hypertension, irregular heartbeat and swelling in legs/feet.  Gastrointestinal: Positive for constipation. Negative for blood in stool and diarrhea.  Endocrine: Negative for increased urination.  Genitourinary: Negative for vaginal dryness.  Musculoskeletal: Positive for arthralgias, joint pain, joint swelling, myalgias, morning  stiffness and myalgias. Negative for muscle weakness and muscle tenderness.  Skin: Negative for color change, rash, hair loss, skin tightness, ulcers and sensitivity to sunlight.  Allergic/Immunologic: Negative for susceptible to infections.  Neurological: Negative for dizziness, memory loss, night sweats and weakness.  Hematological: Negative for swollen glands.  Psychiatric/Behavioral: Negative for depressed mood and sleep disturbance. The patient is not nervous/anxious.     PMFS History:  Patient Active Problem List   Diagnosis Date Noted  . Rheumatoid arthritis with rheumatoid factor of multiple sites without organ or systems involvement (HCC) 09/25/2017  . Raynaud's syndrome without gangrene 09/25/2017  . Medication monitoring encounter 09/25/2017  . Primary osteoarthritis of both hands 09/25/2017  . Primary osteoarthritis of both knees 09/25/2017  . DDD (degenerative disc disease), lumbar 09/25/2017  . Primary insomnia 09/25/2017  . Fibromyalgia 09/25/2017  . Mouth sores 09/25/2017  . Essential hypertension 09/25/2017  . History of gastroesophageal reflux (GERD) 09/25/2017  . History of depression 09/25/2017  . Vitamin D deficiency 09/25/2017  . History of hypercholesterolemia 09/25/2017  . High risk medication use 09/06/2016    History reviewed. No pertinent past medical history.  Family History  Problem Relation Age of Onset  . Hypertension Mother   . Prostate cancer Father   . Hypertension Father   . Hypertension Sister   . Hypercholesterolemia Sister   . Bipolar disorder Sister   . Depression Sister   . Healthy Daughter   . Healthy Son    Past Surgical History:  Procedure Laterality Date  . ABDOMINAL HYSTERECTOMY    . APPENDECTOMY    . CARPAL  TUNNEL RELEASE Bilateral   . CHOLECYSTECTOMY    . ERCP    . KNEE SURGERY Bilateral   . TUBAL LIGATION     Social History   Social History Narrative  . Not on file     Objective: Vital Signs: BP 109/64 (BP  Location: Left Arm, Patient Position: Sitting, Cuff Size: Small)   Pulse 72   Resp 12   Ht 5' 4.75" (1.645 m)   Wt 127 lb (57.6 kg)   BMI 21.30 kg/m    Physical Exam  Constitutional: She is oriented to person, place, and time. She appears well-developed and well-nourished.  HENT:  Head: Normocephalic and atraumatic.  Eyes: Conjunctivae and EOM are normal.  Neck: Normal range of motion.  Cardiovascular: Normal rate, regular rhythm, normal heart sounds and intact distal pulses.  Pulmonary/Chest: Effort normal and breath sounds normal.  Abdominal: Soft. Bowel sounds are normal.  Lymphadenopathy:    She has no cervical adenopathy.  Neurological: She is alert and oriented to person, place, and time.  Skin: Skin is warm and dry. Capillary refill takes less than 2 seconds.  Psychiatric: She has a normal mood and affect. Her behavior is normal.  Nursing note and vitals reviewed.    Musculoskeletal Exam: C-spine thoracic lumbar spine good range of motion.  Shoulder joints elbow joints wrist joint MCPs PIPs DIPs were in good range of motion with no synovitis.  Hip joints knee joints ankles MTPs PIPs DIPs were in good range of motion with no synovitis.  She had discomfort range of motion of all joints.  She also had hyperalgesia and positive tender points.  CDAI Exam: CDAI Homunculus Exam:   Joint Counts:  CDAI Tender Joint count: 0 CDAI Swollen Joint count: 0  Global Assessments:  Patient Global Assessment: 6 Provider Global Assessment: 3  CDAI Calculated Score: 9    Investigation: No additional findings.PLQ eye exam: 04/2017 CBC Latest Ref Rng & Units 12/11/2017 09/14/2017 06/07/2017  WBC 3.8 - 10.8 Thousand/uL 3.2(L) 4.5 4.1  Hemoglobin 11.7 - 15.5 g/dL 14.7 82.9 11.6(L)  Hematocrit 35.0 - 45.0 % 35.0 33.4(L) 34.0(L)  Platelets 140 - 400 Thousand/uL 262 262 263   CMP     Component Value Date/Time   NA 136 12/11/2017 1022   NA 128 (L) 08/28/2016 1350   K 4.4 12/11/2017 1022     CL 94 (L) 12/11/2017 1022   CO2 32 12/11/2017 1022   GLUCOSE 77 12/11/2017 1022   BUN 8 12/11/2017 1022   BUN 8 08/28/2016 1350   CREATININE 0.57 12/11/2017 1022   CALCIUM 9.9 12/11/2017 1022   PROT 7.1 12/11/2017 1022   PROT 7.4 08/28/2016 1350   ALBUMIN 4.6 08/28/2016 1350   AST 36 (H) 12/11/2017 1022   ALT 37 (H) 12/11/2017 1022   ALKPHOS 52 08/28/2016 1350   BILITOT 0.4 12/11/2017 1022   BILITOT 0.4 08/28/2016 1350   GFRNONAA 102 12/11/2017 1022   GFRAA 118 12/11/2017 1022   Imaging: Xr Foot 2 Views Left  Result Date: 12/18/2017 First  MTP, PIP/DIP narrowing was noted.  No intertarsal joint space narrowing was noted.  No tibiotalar joint space narrowing was noted. Impression: mild osteoarthritis of the foot.  Xr Foot 2 Views Right  Result Date: 12/18/2017 First  MTP, PIP/DIP narrowing was noted.  No intertarsal joint space narrowing was noted.  No tibiotalar joint space narrowing was noted. Impression: mild osteoarthritis of the foot.  Xr Hand 2 View Left  Result Date: 12/18/2017 Juxta-articular osteopenia  was noted.  PIP and DIP narrowing was noted.  No intercarpal or radiocarpal joint space narrowing was noted.  No erosive changes were noted. Impression: These findings are consistent with rheumatoid arthritis and osteoarthritis overlap.  Xr Hand 2 View Right  Result Date: 12/18/2017 Juxta-articular osteopenia was noted.  PIP and DIP narrowing was noted.  No intercarpal or radiocarpal joint space narrowing was noted.  No erosive changes were noted. Impression: These findings are consistent with rheumatoid arthritis and osteoarthritis overlap.   Speciality Comments: No specialty comments available.    Procedures:  No procedures performed Allergies: Baclofen; Gabapentin; Ivp dye [iodinated diagnostic agents]; and Sulfa antibiotics   Assessment / Plan:     Visit Diagnoses: Rheumatoid arthritis with rheumatoid factor of multiple sites without organ or systems involvement  (HCC) - +RF,+ANA -patient has been complaining of increased arthralgias and stiffness lasting all day and intermittent swelling.  I do not see any synovitis on examination.  I will obtain following labs and also schedule ultrasound of bilateral hands to evaluate this further.  She was given a course of prednisone recently patient reports no response to the prednisone therapy.  Her methotrexate dose was recently lowered due to drop in her [place] cell count and also elevation of her LFTs.  She states she has been taking NSAIDs and also Tylenol for pain relief.  Plan: Rheumatoid factor, Sedimentation rate, 14-3-3 eta Protein, Cyclic citrul peptide antibody, IgG  High risk medication use - MTX 6 tablets weekly, folic acid 2 mg, PLQ 200 mg BID M-F.eye exam: 04/2017.  Her most recent labs showed neutropenia and elevation of LFTs.  We will check labs in a month and then every 3 months to monitor for drug toxicity.  Medication monitoring encounter - Tramadol. UDS: 9/19/2018Narc agreement: 05/01/2017.  Patient has been taking 1 tramadol in the morning and 2 tramadol's at bedtime.  Have advised her to taper her prednisone if possible.  She states she will be taking 1 tramadol in the morning and 2 at bedtime.  Pain in both hands - Plan: XR Hand 2 View Right, XR Hand 2 View Left x-ray findings were consistent with mild osteoarthritis and rheumatoid arthritis overlap.  No erosive changes were noted.  Pain in both feet - Plan: XR Foot 2 Views Right, XR Foot 2 Views Left.  X-ray findings were consistent with mild osteoarthritis.  Raynaud's syndrome without gangrene-she is currently not symptomatic.  Primary osteoarthritis of both hands-the clinical findings are consistent with osteoarthritis of bilateral hands.  Primary osteoarthritis of both knees-she has osteoarthritis in bilateral knee joints.  Fibromyalgia - She continues to have fatigue and inosmnia.  She is on Flexeril and Robaxin.  She takes Tramadol for pain  relief.  She continues to have some generalized pain and discomfort.  DDD (degenerative disc disease), lumbar - s/p fusion.  She is in chronic pain.  Primary insomnia-her insomnia is better with current medications.   History of vitamin D deficiency  History of hypertension  History of depression  History of hypercholesterolemia  History of gastroesophageal reflux (GERD)   Orders: Orders Placed This Encounter  Procedures  . XR Hand 2 View Right  . XR Hand 2 View Left  . XR Foot 2 Views Right  . XR Foot 2 Views Left  . Rheumatoid factor  . Sedimentation rate  . 14-3-3 eta Protein  . Cyclic citrul peptide antibody, IgG  . VITAMIN D 25 Hydroxy (Vit-D Deficiency, Fractures)   No orders of the defined types  were placed in this encounter.   Face-to-face time spent with patient was 30 minutes.> 50% of time was spent in counseling and coordination of care.  Follow-Up Instructions: Return in about 5 months (around 05/20/2018) for Rheumatoid arthritis, Osteoarthritis.   Pollyann SavoyShaili Tyr Franca, MD  Note - This record has been created using Animal nutritionistDragon software.  Chart creation errors have been sought, but may not always  have been located. Such creation errors do not reflect on  the standard of medical care.

## 2017-12-11 ENCOUNTER — Other Ambulatory Visit: Payer: Self-pay

## 2017-12-11 DIAGNOSIS — Z5181 Encounter for therapeutic drug level monitoring: Secondary | ICD-10-CM

## 2017-12-11 DIAGNOSIS — Z79899 Other long term (current) drug therapy: Secondary | ICD-10-CM

## 2017-12-11 LAB — CBC WITH DIFFERENTIAL/PLATELET
Basophils Absolute: 29 cells/uL (ref 0–200)
Basophils Relative: 0.9 %
EOS ABS: 80 {cells}/uL (ref 15–500)
Eosinophils Relative: 2.5 %
HCT: 35 % (ref 35.0–45.0)
Hemoglobin: 11.9 g/dL (ref 11.7–15.5)
Lymphs Abs: 1290 cells/uL (ref 850–3900)
MCH: 30.1 pg (ref 27.0–33.0)
MCHC: 34 g/dL (ref 32.0–36.0)
MCV: 88.6 fL (ref 80.0–100.0)
MONOS PCT: 10 %
MPV: 10.3 fL (ref 7.5–12.5)
NEUTROS PCT: 46.3 %
Neutro Abs: 1482 cells/uL — ABNORMAL LOW (ref 1500–7800)
PLATELETS: 262 10*3/uL (ref 140–400)
RBC: 3.95 10*6/uL (ref 3.80–5.10)
RDW: 13.7 % (ref 11.0–15.0)
TOTAL LYMPHOCYTE: 40.3 %
WBC: 3.2 10*3/uL — AB (ref 3.8–10.8)
WBCMIX: 320 {cells}/uL (ref 200–950)

## 2017-12-12 LAB — PAIN MGMT, PROFILE 5 W/CONF, U
Amphetamines: NEGATIVE ng/mL (ref <500)
Barbiturates: NEGATIVE ng/mL (ref <300)
Benzodiazepines: NEGATIVE ng/mL (ref <100)
COCAINE METABOLITE: NEGATIVE ng/mL (ref <150)
Creatinine: 33.6 mg/dL
Marijuana Metabolite: NEGATIVE ng/mL (ref <20)
Methadone Metabolite: NEGATIVE ng/mL (ref <100)
OPIATES: NEGATIVE ng/mL (ref <100)
OXIDANT: NEGATIVE ug/mL (ref <200)
Oxycodone: NEGATIVE ng/mL (ref <100)
PH: 6.86 (ref 4.5–9.0)

## 2017-12-12 LAB — COMPLETE METABOLIC PANEL WITH GFR
AG Ratio: 1.7 (calc) (ref 1.0–2.5)
ALBUMIN MSPROF: 4.5 g/dL (ref 3.6–5.1)
ALT: 37 U/L — ABNORMAL HIGH (ref 6–29)
AST: 36 U/L — ABNORMAL HIGH (ref 10–35)
Alkaline phosphatase (APISO): 47 U/L (ref 33–130)
BUN: 8 mg/dL (ref 7–25)
CALCIUM: 9.9 mg/dL (ref 8.6–10.4)
CO2: 32 mmol/L (ref 20–32)
CREATININE: 0.57 mg/dL (ref 0.50–1.05)
Chloride: 94 mmol/L — ABNORMAL LOW (ref 98–110)
GFR, EST NON AFRICAN AMERICAN: 102 mL/min/{1.73_m2} (ref >=60)
GFR, Est African American: 118 mL/min/{1.73_m2} (ref >=60)
Globulin: 2.6 g/dL (calc) (ref 1.9–3.7)
Glucose, Bld: 77 mg/dL (ref 65–99)
Potassium: 4.4 mmol/L (ref 3.5–5.3)
Sodium: 136 mmol/L (ref 135–146)
TOTAL PROTEIN: 7.1 g/dL (ref 6.1–8.1)
Total Bilirubin: 0.4 mg/dL (ref 0.2–1.2)

## 2017-12-13 LAB — PAIN MGMT, TRAMADOL W/MEDMATCH, U
Desmethyltramadol: 2221 ng/mL — ABNORMAL HIGH (ref <100)
Tramadol: 21408 ng/mL — ABNORMAL HIGH (ref <100)

## 2017-12-13 NOTE — Progress Notes (Signed)
LFTs are mildly elevated.  Please advise patient to avoid alcohol, NSAIDS, and tylenol.  She is on MTX 6 tablets and PLQ.  She was flaring at her last visit and was taking tylenol and Aleve.  She was given a prednisone taper.  We will recheck LFTs in 2 months. WBC is 3.2. Please advise her to lower her dose of MTX to 5 tablets po once weekly. UDS is consistent with treatment.

## 2017-12-14 ENCOUNTER — Other Ambulatory Visit: Payer: Self-pay | Admitting: Rheumatology

## 2017-12-14 NOTE — Telephone Encounter (Signed)
Last visit: 09/27/2017 Next visit: 12/18/2017 Labs: 12/11/17 LFTs are mildly elevated. PLQ eye exam: 04/2017. WNL  Okay to refill per Dr. Corliss Skains

## 2017-12-17 ENCOUNTER — Other Ambulatory Visit: Payer: Self-pay | Admitting: Rheumatology

## 2017-12-17 NOTE — Telephone Encounter (Signed)
Last visit: 09/27/2017 Next visit: 12/18/2017 Labs: 12/11/17 LFTs are mildly elevated. WBC is 3.2.

## 2017-12-18 ENCOUNTER — Ambulatory Visit (INDEPENDENT_AMBULATORY_CARE_PROVIDER_SITE_OTHER): Payer: Self-pay

## 2017-12-18 ENCOUNTER — Ambulatory Visit (INDEPENDENT_AMBULATORY_CARE_PROVIDER_SITE_OTHER): Payer: Medicare Other | Admitting: Rheumatology

## 2017-12-18 ENCOUNTER — Encounter: Payer: Self-pay | Admitting: Rheumatology

## 2017-12-18 VITALS — BP 109/64 | HR 72 | Resp 12 | Ht 64.75 in | Wt 127.0 lb

## 2017-12-18 DIAGNOSIS — M0579 Rheumatoid arthritis with rheumatoid factor of multiple sites without organ or systems involvement: Secondary | ICD-10-CM

## 2017-12-18 DIAGNOSIS — Z8719 Personal history of other diseases of the digestive system: Secondary | ICD-10-CM

## 2017-12-18 DIAGNOSIS — Z8639 Personal history of other endocrine, nutritional and metabolic disease: Secondary | ICD-10-CM | POA: Diagnosis not present

## 2017-12-18 DIAGNOSIS — M797 Fibromyalgia: Secondary | ICD-10-CM | POA: Diagnosis not present

## 2017-12-18 DIAGNOSIS — M5136 Other intervertebral disc degeneration, lumbar region: Secondary | ICD-10-CM

## 2017-12-18 DIAGNOSIS — Z8679 Personal history of other diseases of the circulatory system: Secondary | ICD-10-CM | POA: Diagnosis not present

## 2017-12-18 DIAGNOSIS — K1379 Other lesions of oral mucosa: Secondary | ICD-10-CM

## 2017-12-18 DIAGNOSIS — Z5181 Encounter for therapeutic drug level monitoring: Secondary | ICD-10-CM

## 2017-12-18 DIAGNOSIS — M79672 Pain in left foot: Secondary | ICD-10-CM

## 2017-12-18 DIAGNOSIS — M79642 Pain in left hand: Secondary | ICD-10-CM

## 2017-12-18 DIAGNOSIS — M170 Bilateral primary osteoarthritis of knee: Secondary | ICD-10-CM | POA: Diagnosis not present

## 2017-12-18 DIAGNOSIS — I7300 Raynaud's syndrome without gangrene: Secondary | ICD-10-CM

## 2017-12-18 DIAGNOSIS — F5101 Primary insomnia: Secondary | ICD-10-CM | POA: Diagnosis not present

## 2017-12-18 DIAGNOSIS — M79671 Pain in right foot: Secondary | ICD-10-CM

## 2017-12-18 DIAGNOSIS — Z79899 Other long term (current) drug therapy: Secondary | ICD-10-CM | POA: Diagnosis not present

## 2017-12-18 DIAGNOSIS — M19041 Primary osteoarthritis, right hand: Secondary | ICD-10-CM | POA: Diagnosis not present

## 2017-12-18 DIAGNOSIS — M19042 Primary osteoarthritis, left hand: Secondary | ICD-10-CM

## 2017-12-18 DIAGNOSIS — M79641 Pain in right hand: Secondary | ICD-10-CM

## 2017-12-18 DIAGNOSIS — M51369 Other intervertebral disc degeneration, lumbar region without mention of lumbar back pain or lower extremity pain: Secondary | ICD-10-CM

## 2017-12-18 DIAGNOSIS — Z8659 Personal history of other mental and behavioral disorders: Secondary | ICD-10-CM

## 2017-12-18 NOTE — Patient Instructions (Signed)
Standing Labs We placed an order today for your standing lab work.    Please come back and get your standing labs in 1 month and then every 3 months  We have open lab Monday through Friday from 8:30-11:30 AM and 1:30-4:00 PM  at the office of Dr. Chinedu Agustin.   You may experience shorter wait times on Monday and Friday afternoons. The office is located at [address], [address], [place], [place] [address] No appointment is necessary.   Labs are drawn by [facility].  You may receive a bill from [facility] for your lab work. If you have any questions regarding directions or hours of operation,  please call [phone].    

## 2017-12-26 ENCOUNTER — Ambulatory Visit (INDEPENDENT_AMBULATORY_CARE_PROVIDER_SITE_OTHER): Payer: Medicare Other | Admitting: Rheumatology

## 2017-12-26 ENCOUNTER — Ambulatory Visit (INDEPENDENT_AMBULATORY_CARE_PROVIDER_SITE_OTHER): Payer: Self-pay

## 2017-12-26 DIAGNOSIS — M79642 Pain in left hand: Secondary | ICD-10-CM

## 2017-12-26 DIAGNOSIS — M79641 Pain in right hand: Secondary | ICD-10-CM | POA: Diagnosis not present

## 2017-12-26 NOTE — Progress Notes (Signed)
Ultrasound examination of bilateral hands was performed per EULAR recommendations. Using 12 MHz transducer, grayscale and power Doppler bilateral second, third, and fifth MCP joints and bilateral wrist joints both dorsal and volar aspects were evaluated to look for synovitis or tenosynovitis. The findings were there was no synovitis or tenosynovitis on ultrasound examination.  Synovial thickening was noted in bilateral second MCP joint.  Right median nerve was 0.11 cm squares which was within normal limits and left median nerve was 0.08 cm squares which was within normal limits.  Impression: Ultrasound examination was consistent with rheumatoid arthritis.  No active synovitis was noted.  Bilateral median nerves were within normal limits. Pollyann Savoy, MD

## 2017-12-31 ENCOUNTER — Other Ambulatory Visit: Payer: Self-pay | Admitting: *Deleted

## 2017-12-31 MED ORDER — FOLIC ACID 1 MG PO TABS: 2.0000 mg | ORAL_TABLET | Freq: Every day | ORAL | 3 refills | Status: DC

## 2017-12-31 NOTE — Telephone Encounter (Signed)
Last Visit: 12/18/17 Next visit: 05/20/18  Okay to refill per Dr. Corliss Skains

## 2018-01-08 ENCOUNTER — Telehealth: Payer: Self-pay | Admitting: Rheumatology

## 2018-01-08 MED ORDER — TRAMADOL HCL 50 MG PO TABS: ORAL_TABLET | ORAL | 0 refills | Status: DC

## 2018-01-08 NOTE — Telephone Encounter (Signed)
Last visit: 12/18/2017 Next visit: 05/20/2018 UDS: 12/11/2017 Narc agreement: 05/02/2017  Okay to refill tramadol?

## 2018-01-08 NOTE — Telephone Encounter (Signed)
Patient called requesting prescription refill of Tramadol to be sent to [facility].

## 2018-01-08 NOTE — Telephone Encounter (Signed)
ok 

## 2018-01-10 ENCOUNTER — Telehealth: Payer: Self-pay | Admitting: Rheumatology

## 2018-01-10 NOTE — Telephone Encounter (Signed)
Left message to advise patient prescription was sent to the pharmacy on 01/08/18 and to contact the pharmacy. Advised to call the office if she has any trouble getting the prescription.

## 2018-01-10 NOTE — Telephone Encounter (Signed)
Patient calling requesting Ultram refill sent to [facility].

## 2018-01-11 ENCOUNTER — Telehealth: Payer: Self-pay | Admitting: Rheumatology

## 2018-01-11 NOTE — Telephone Encounter (Signed)
Patient calling requesting Ultram RX 90 day supply to be sent to [facility] [address] [place]. Other pharmacy is not getting it for some reason, and patient is almost out.

## 2018-01-15 NOTE — Telephone Encounter (Signed)
Patient advised prescription has been received by the pharmacy and processed. Prescription was sent our on 01/13/18 and patient should received in on 01/17/18.

## 2018-02-22 ENCOUNTER — Other Ambulatory Visit: Payer: Self-pay | Admitting: Rheumatology

## 2018-02-22 NOTE — Telephone Encounter (Signed)
Last Visit: 12/18/17 Next visit: 05/20/18 Labs: 12/11/17 cbc/cmp- WBC 3.2 PLQ Eye Exam: 10/22/17 WNL   Okay to refill per Dr. Corliss Skainseveshwar

## 2018-03-15 ENCOUNTER — Telehealth: Payer: Self-pay | Admitting: Rheumatology

## 2018-03-15 MED ORDER — METHOTREXATE 2.5 MG PO TABS: 12.5000 mg | ORAL_TABLET | ORAL | 0 refills | Status: DC

## 2018-03-15 NOTE — Telephone Encounter (Signed)
Last visit: 12/18/2017 Next visit: 05/20/2018 Labs: 12/11/2017 LFTs are mildly elevated.  Advised patient she is due to update labs and patient states she had labs done at another office and she will request that they are faxed over.   Okay to refill per Dr. Corliss Skainseveshwar.

## 2018-03-15 NOTE — Telephone Encounter (Signed)
Patient called requesting prescription refill of Methotrexate to be sent to [facility] on [address] [place].

## 2018-03-29 NOTE — Progress Notes (Signed)
Office Visit Note  Patient: Mary HoseDeborah Mumford             Date of Birth: 05/16/1961           MRN: [id number]             PCP: Lynn ItoSmith, Lori, MD Referring: Lynn ItoSmith, Lori, MD Visit Date: 04/01/2018 Occupation: @GUAROCC @  Subjective:  Increased pain in neck, hands and feet.   History of Present Illness: Mary Lane is a 57 y.o. female with history of rheumatoid arthritis.  She states she has been having increased pain and discomfort in her bilateral hands and feet.  She has noticed intermittent swelling in her hands and feet.  She has been also having discomfort in her C-spine.  She states she is having spasms in the muscles around the trapezius area due to neck pain.  She has off-and-on discomfort in her knee joints.  Her back pain is tolerable.  She does have some generalized pain but it is difficult for her to differentiate if it is from fibromyalgia or arthritis.   Activities of Daily Living:  Patient reports morning stiffness for several hours.   Patient Reports nocturnal pain.  Difficulty dressing/grooming: Denies Difficulty climbing stairs: Denies Difficulty getting out of chair: Denies Difficulty using hands for taps, buttons, cutlery, and/or writing: Denies  Review of Systems  Constitutional: Positive for fatigue.  HENT: Positive for mouth dryness. Negative for mouth sores, trouble swallowing and trouble swallowing.   Eyes: Positive for dryness.  Respiratory: Negative for shortness of breath and difficulty breathing.   Cardiovascular: Negative for chest pain and swelling in legs/feet.  Gastrointestinal: Negative for abdominal pain, constipation, diarrhea, nausea and vomiting.  Endocrine: Negative for increased urination.  Genitourinary: Negative for pelvic pain.  Musculoskeletal: Positive for arthralgias, joint pain, joint swelling and morning stiffness.  Skin: Negative for rash and hair loss.  Allergic/Immunologic: Negative for susceptible to infections.  Neurological:  Positive for headaches. Negative for dizziness, light-headedness, numbness, memory loss and weakness.  Hematological: Negative for bruising/bleeding tendency.  Psychiatric/Behavioral: Negative for confusion.    PMFS History:  Patient Active Problem List   Diagnosis Date Noted  . Rheumatoid arthritis with rheumatoid factor of multiple sites without organ or systems involvement (HCC) 09/25/2017  . Raynaud's syndrome without gangrene 09/25/2017  . Medication monitoring encounter 09/25/2017  . Primary osteoarthritis of both hands 09/25/2017  . Primary osteoarthritis of both knees 09/25/2017  . DDD (degenerative disc disease), lumbar 09/25/2017  . Primary insomnia 09/25/2017  . Fibromyalgia 09/25/2017  . Mouth sores 09/25/2017  . Essential hypertension 09/25/2017  . History of gastroesophageal reflux (GERD) 09/25/2017  . History of depression 09/25/2017  . Vitamin D deficiency 09/25/2017  . History of hypercholesterolemia 09/25/2017  . High risk medication use 09/06/2016    History reviewed. No pertinent past medical history.  Family History  Problem Relation Age of Onset  . Hypertension Mother   . Prostate cancer Father   . Hypertension Father   . Hypertension Sister   . Hypercholesterolemia Sister   . Bipolar disorder Sister   . Depression Sister   . Healthy Daughter   . Healthy Son    Past Surgical History:  Procedure Laterality Date  . ABDOMINAL HYSTERECTOMY    . APPENDECTOMY    . CARPAL TUNNEL RELEASE Bilateral   . CHOLECYSTECTOMY    . ERCP    . KNEE SURGERY Bilateral   . TUBAL LIGATION     Social History   Social History Narrative  .  Not on file    Objective: Vital Signs: BP 115/70 (BP Location: Left Arm, Patient Position: Sitting, Cuff Size: Normal)   Pulse 73   Resp 16   Ht 5' 4.75" (1.645 m)   Wt 124 lb 3.2 oz (56.3 kg)   BMI 20.83 kg/m    Physical Exam  Constitutional: She is oriented to person, place, and time. She appears well-developed and  well-nourished.  HENT:  Head: Normocephalic and atraumatic.  Eyes: Conjunctivae and EOM are normal.  Neck: Normal range of motion.  Cardiovascular: Normal rate, regular rhythm, normal heart sounds and intact distal pulses.  Pulmonary/Chest: Effort normal and breath sounds normal.  Abdominal: Soft. Bowel sounds are normal.  Lymphadenopathy:    She has no cervical adenopathy.  Neurological: She is alert and oriented to person, place, and time.  Skin: Skin is warm and dry. Capillary refill takes less than 2 seconds.  Psychiatric: She has a normal mood and affect. Her behavior is normal.  Nursing note and vitals reviewed.    Musculoskeletal Exam: She is painful range of motion of her cervical spine.  Thoracic spine with good range of motion.  She has limited range of motion of lumbar spine due to fusion.  Shoulder joints elbow joints wrist joints were in good range of motion.  She has DIP and PIP thickening in her hands.  No synovitis was seen.  Hip joints knee joints ankles MTPs PIPs were in good range of motion.  She has some DIP PIP and first MTP thickening consistent with osteoarthritis.  She has some generalized hyperalgesia treated with fibromyalgia.  CDAI Exam: CDAI Score: 0.6  Patient Global Assessment: 4 (mm); Provider Global Assessment: 2 (mm) Swollen: 0 ; Tender: 0  Joint Exam   Not documented   There is currently no information documented on the homunculus. Go to the Rheumatology activity and complete the homunculus joint exam.  Investigation: No additional findings.  Imaging: No results found.  Recent Labs: Lab Results  Component Value Date   WBC 3.2 (L) 12/11/2017   HGB 11.9 12/11/2017   PLT 262 12/11/2017   NA 136 12/11/2017   K 4.4 12/11/2017   CL 94 (L) 12/11/2017   CO2 32 12/11/2017   GLUCOSE 77 12/11/2017   BUN 8 12/11/2017   CREATININE 0.57 12/11/2017   BILITOT 0.4 12/11/2017   ALKPHOS 52 08/28/2016   AST 36 (H) 12/11/2017   ALT 37 (H) 12/11/2017    PROT 7.1 12/11/2017   ALBUMIN 4.6 08/28/2016   CALCIUM 9.9 12/11/2017   GFRAA 118 12/11/2017    Speciality Comments: PLQ Eye Exam: 10/22/17 WNL   Procedures:  No procedures performed Allergies: Baclofen; Gabapentin; Ivp dye [iodinated diagnostic agents]; and Sulfa antibiotics   Assessment / Plan:     Visit Diagnoses: Rheumatoid arthritis with rheumatoid factor of multiple sites without organ or systems involvement (HCC) - +RF,+ANA.  Patient complains of increased pain and discomfort in her joints.  I do not appreciate any synovitis.  I will schedule ultrasound of bilateral hands to look for synovitis.  At this point she will continue on the current regimen.  High risk medication use - PLQ 200 mg po bid M-F, MTX 5 tabs po q wk, folic acid 2 mg po qd. eye exam: 10/22/2017 - Plan: CBC with Differential/Platelet, COMPLETE METABOLIC PANEL WITH GFR.  Have advised her to reduce folic acid 2 mg p.o. daily.  Medication monitoring encounter - tramadol UDS: 4/30/2019narc agreement: 05/02/2017  Neck pain-she has been having  increased neck pain and stiffness.  She also complains of occipital region pain.  I will obtain x-ray of her C-spine today.  Primary osteoarthritis of both hands-she has chronic pain and discomfort.  Joint protection was discussed.  Primary osteoarthritis of both knees-chronic pain in her knee joints.  Raynaud's syndrome without gangrene-currently not active.  Fibromyalgia - She continues to have fatigue and inosmnia.  She is on Flexeril and Robaxin.  She takes Tramadol for pain relief.  DDD (degenerative disc disease), lumbar - s/p fusion.   Primary insomnia-good sleep hygiene was discussed.  History of gastroesophageal reflux (GERD)  History of vitamin D deficiency-she is on supplement.  History of depression  History of hypertension-her blood pressure is controlled.  History of hypercholesterolemia   Orders: Orders Placed This Encounter  Procedures  . DG Cervical  Spine With Flex & Extend  . CBC with Differential/Platelet  . COMPLETE METABOLIC PANEL WITH GFR   No orders of the defined types were placed in this encounter.   Face-to-face time spent with patient was 30 minutes. Greater than 50% of time was spent in counseling and coordination of care.  Follow-Up Instructions: Return in about 5 months (around 09/01/2018) for Rheumatoid arthritis, Osteoarthritis, FMS.   Pollyann Savoy, MD  Note - This record has been created using Animal nutritionist.  Chart creation errors have been sought, but may not always  have been located. Such creation errors do not reflect on  the standard of medical care.

## 2018-04-01 ENCOUNTER — Ambulatory Visit (HOSPITAL_COMMUNITY)
Admission: RE | Admit: 2018-04-01 | Discharge: 2018-04-01 | Disposition: A | Discharge status: Home / Self Care | Payer: Medicare Other | Source: Ambulatory Visit | Attending: Rheumatology | Admitting: Rheumatology

## 2018-04-01 ENCOUNTER — Ambulatory Visit (INDEPENDENT_AMBULATORY_CARE_PROVIDER_SITE_OTHER): Payer: Medicare Other | Admitting: Rheumatology

## 2018-04-01 ENCOUNTER — Encounter: Payer: Self-pay | Admitting: Rheumatology

## 2018-04-01 VITALS — BP 115/70 | HR 73 | Resp 16 | Ht 64.75 in | Wt 124.2 lb

## 2018-04-01 DIAGNOSIS — I7300 Raynaud's syndrome without gangrene: Secondary | ICD-10-CM

## 2018-04-01 DIAGNOSIS — Z8659 Personal history of other mental and behavioral disorders: Secondary | ICD-10-CM

## 2018-04-01 DIAGNOSIS — Q7649 Other congenital malformations of spine, not associated with scoliosis: Secondary | ICD-10-CM | POA: Diagnosis not present

## 2018-04-01 DIAGNOSIS — M0579 Rheumatoid arthritis with rheumatoid factor of multiple sites without organ or systems involvement: Secondary | ICD-10-CM | POA: Diagnosis not present

## 2018-04-01 DIAGNOSIS — M19041 Primary osteoarthritis, right hand: Secondary | ICD-10-CM

## 2018-04-01 DIAGNOSIS — F5101 Primary insomnia: Secondary | ICD-10-CM

## 2018-04-01 DIAGNOSIS — Z79899 Other long term (current) drug therapy: Secondary | ICD-10-CM

## 2018-04-01 DIAGNOSIS — M51369 Other intervertebral disc degeneration, lumbar region without mention of lumbar back pain or lower extremity pain: Secondary | ICD-10-CM

## 2018-04-01 DIAGNOSIS — M50322 Other cervical disc degeneration at C5-C6 level: Secondary | ICD-10-CM | POA: Diagnosis not present

## 2018-04-01 DIAGNOSIS — M542 Cervicalgia: Secondary | ICD-10-CM

## 2018-04-01 DIAGNOSIS — M19042 Primary osteoarthritis, left hand: Secondary | ICD-10-CM

## 2018-04-01 DIAGNOSIS — M797 Fibromyalgia: Secondary | ICD-10-CM

## 2018-04-01 DIAGNOSIS — Z8639 Personal history of other endocrine, nutritional and metabolic disease: Secondary | ICD-10-CM

## 2018-04-01 DIAGNOSIS — Z5181 Encounter for therapeutic drug level monitoring: Secondary | ICD-10-CM | POA: Diagnosis not present

## 2018-04-01 DIAGNOSIS — M5136 Other intervertebral disc degeneration, lumbar region: Secondary | ICD-10-CM

## 2018-04-01 DIAGNOSIS — Z8719 Personal history of other diseases of the digestive system: Secondary | ICD-10-CM

## 2018-04-01 DIAGNOSIS — M170 Bilateral primary osteoarthritis of knee: Secondary | ICD-10-CM

## 2018-04-01 DIAGNOSIS — Z8679 Personal history of other diseases of the circulatory system: Secondary | ICD-10-CM

## 2018-04-01 NOTE — Progress Notes (Signed)
Findings were discussed with patient.

## 2018-04-01 NOTE — Patient Instructions (Signed)
Standing Labs We placed an order today for your standing lab work.    Please come back and get your standing labs in November and every 3 months   We have open lab Monday through Friday from 8:30-11:30 AM and 1:30-4:00 PM  at the office of Dr. Keondrick Dilks.   You may experience shorter wait times on Monday and Friday afternoons. The office is located at [address], [address], [place], [place] [address] No appointment is necessary.   Labs are drawn by [facility].  You may receive a bill from [facility] for your lab work. If you have any questions regarding directions or hours of operation,  please call [phone].     

## 2018-04-02 LAB — CBC WITH DIFFERENTIAL/PLATELET
BASOS ABS: 39 {cells}/uL (ref 0–200)
Basophils Relative: 1 %
EOS PCT: 1.8 %
Eosinophils Absolute: 70 cells/uL (ref 15–500)
HEMATOCRIT: 35.5 % (ref 35.0–45.0)
HEMOGLOBIN: 11.9 g/dL (ref 11.7–15.5)
LYMPHS ABS: 1490 {cells}/uL (ref 850–3900)
MCH: 30.9 pg (ref 27.0–33.0)
MCHC: 33.5 g/dL (ref 32.0–36.0)
MCV: 92.2 fL (ref 80.0–100.0)
MPV: 10.6 fL (ref 7.5–12.5)
Monocytes Relative: 8.5 %
NEUTROS ABS: 1970 {cells}/uL (ref 1500–7800)
Neutrophils Relative %: 50.5 %
Platelets: 256 10*3/uL (ref 140–400)
RBC: 3.85 10*6/uL (ref 3.80–5.10)
RDW: 13.8 % (ref 11.0–15.0)
Total Lymphocyte: 38.2 %
WBC mixed population: 332 cells/uL (ref 200–950)
WBC: 3.9 10*3/uL (ref 3.8–10.8)

## 2018-04-02 LAB — COMPLETE METABOLIC PANEL WITH GFR
AG RATIO: 1.7 (calc) (ref 1.0–2.5)
ALT: 22 U/L (ref 6–29)
AST: 23 U/L (ref 10–35)
Albumin: 4.6 g/dL (ref 3.6–5.1)
Alkaline phosphatase (APISO): 50 U/L (ref 33–130)
BUN: 7 mg/dL (ref 7–25)
CALCIUM: 9.8 mg/dL (ref 8.6–10.4)
CO2: 33 mmol/L — ABNORMAL HIGH (ref 20–32)
Chloride: 95 mmol/L — ABNORMAL LOW (ref 98–110)
Creat: 0.55 mg/dL (ref 0.50–1.05)
GFR, EST NON AFRICAN AMERICAN: 103 mL/min/{1.73_m2} (ref >=60)
GFR, Est African American: 120 mL/min/{1.73_m2} (ref >=60)
GLOBULIN: 2.7 g/dL (ref 1.9–3.7)
Glucose, Bld: 81 mg/dL (ref 65–99)
POTASSIUM: 4.4 mmol/L (ref 3.5–5.3)
Sodium: 132 mmol/L — ABNORMAL LOW (ref 135–146)
Total Bilirubin: 0.4 mg/dL (ref 0.2–1.2)
Total Protein: 7.3 g/dL (ref 6.1–8.1)

## 2018-04-02 NOTE — Progress Notes (Signed)
stable °

## 2018-04-05 ENCOUNTER — Other Ambulatory Visit: Payer: Self-pay | Admitting: *Deleted

## 2018-04-05 ENCOUNTER — Telehealth: Payer: Self-pay | Admitting: Rheumatology

## 2018-04-05 NOTE — Telephone Encounter (Signed)
Patient states she saw Dr. Corliss Skainseveshwar on 04/01/18. Patient states she had x-rays at that appointment. Per x-ray Degenerative disc disease C4-C6. Multilevel facet arthropathy also seen. 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   Patient states she has tried Aleve, Tylenol, heat. Patient states she is not getting any relief. Patient states she is also taking Robaxin and Flexeril as prescribed. Patient states she is also on Tramadol 2 at night and 1 in the morning without relief. Patient is on PLQ and MTX as prescribed. Patient is requesting a stronger anti inflammatory. She states she has not had an relief from what she has tried thus far. Please advise.

## 2018-04-05 NOTE — Telephone Encounter (Signed)
Refill request via fax  Last Visit: 04/01/18 Next Visit: 04/10/18 UDS: 12/11/17 Narc Agreement: 05/01/17  Okay to refill Tramadol?

## 2018-04-05 NOTE — Telephone Encounter (Signed)
Patient advised recommendation from Dr. Deveshwar is to see a spine speciCorliss Skainsalist. Patient has [facility] and will call to make an appointment.

## 2018-04-05 NOTE — Telephone Encounter (Signed)
Patient left a voicemail stating she saw Dr. Corliss Skainseveshwar on 04/01/18 and she is requesting stronger anti-inflammatories.   Patient requested a return call.

## 2018-04-05 NOTE — Telephone Encounter (Signed)
Patient will have to see a spine specialist.  If she does not have specialist please refer her.

## 2018-04-08 ENCOUNTER — Telehealth: Payer: Self-pay | Admitting: Rheumatology

## 2018-04-08 MED ORDER — TRAMADOL HCL 50 MG PO TABS: ORAL_TABLET | ORAL | 0 refills | Status: DC

## 2018-04-08 NOTE — Telephone Encounter (Signed)
Last office note has been faxed to Dr. Denyse AmassHsu's office.

## 2018-04-08 NOTE — Telephone Encounter (Signed)
Patient called stating she spoke with Dr. Raynald KempHsu office, Neurologist at [facility] and they are requesting the written notes from her appointment on 8/19, as well as a CD with the x-ray of her cervical spine.  Please fax the written report to: [phone]  Attention:  Sonja   Patient requested a return call when the CD is ready to be picked up.

## 2018-04-08 NOTE — Telephone Encounter (Signed)
Patient states she previously saw Dr. Cheri KearnsShu previously. Patient states she will call to try to schedule an appointment and if they will not schedule an appointment will call back for referral to be placed.

## 2018-04-08 NOTE — Telephone Encounter (Signed)
ok 

## 2018-04-08 NOTE — Telephone Encounter (Signed)
Patient calling to request a referral to a Neurologist. Patient does not have one. Please call to discuss. Neurolog[facility] past [facility] Cheri KearnsShu.

## 2018-04-09 NOTE — Telephone Encounter (Signed)
I called patient, x-ray of C-spine at front desk

## 2018-04-10 ENCOUNTER — Other Ambulatory Visit: Payer: Medicare Other | Admitting: Rheumatology

## 2018-04-26 ENCOUNTER — Other Ambulatory Visit: Payer: Self-pay | Admitting: Rheumatology

## 2018-04-26 NOTE — Telephone Encounter (Signed)
Last Visit: 04/01/18 Next Visit: 09/05/18 Labs: 04/01/18 stable PLQ Eye Exam:  10/22/17 WNL   Okay to refill per Dr. Corliss Skainseveshwar

## 2018-05-20 ENCOUNTER — Ambulatory Visit: Payer: Medicare Other | Admitting: Rheumatology

## 2018-06-13 ENCOUNTER — Other Ambulatory Visit: Payer: Self-pay | Admitting: Rheumatology

## 2018-06-13 NOTE — Telephone Encounter (Signed)
Last Visit: 04/01/18 Next Visit: 09/05/18 Labs: 04/01/18 stable  Okay to refill per Dr. Corliss Skains

## 2018-06-25 ENCOUNTER — Other Ambulatory Visit: Payer: Self-pay | Admitting: Rheumatology

## 2018-06-26 NOTE — Telephone Encounter (Signed)
Last Visit: 04/01/18 Next Visit: 09/05/18  Okay to refill per Dr. Deveshwar 

## 2018-06-27 ENCOUNTER — Other Ambulatory Visit: Payer: Self-pay | Admitting: *Deleted

## 2018-06-27 MED ORDER — TRAMADOL HCL 50 MG PO TABS: ORAL_TABLET | ORAL | 0 refills | Status: DC

## 2018-06-27 NOTE — Telephone Encounter (Signed)
ok 

## 2018-06-27 NOTE — Telephone Encounter (Signed)
Refill request via fax  Last Visit: 04/01/18 Next Visit: 09/05/18 UDS: 12/11/17 Narc Agreement: 05/01/17  Okay to refill Tramadol?

## 2018-07-19 ENCOUNTER — Other Ambulatory Visit: Payer: Self-pay | Admitting: Rheumatology

## 2018-07-19 NOTE — Telephone Encounter (Signed)
Last Visit: 04/01/18 Next Visit: 09/05/18 Labs: 04/01/18 stable PLQ Eye Exam:  10/22/17 WNL   Okay to refill per Dr. Deveshwar 

## 2018-08-19 ENCOUNTER — Other Ambulatory Visit: Payer: Self-pay | Admitting: *Deleted

## 2018-08-19 DIAGNOSIS — Z79899 Other long term (current) drug therapy: Secondary | ICD-10-CM

## 2018-08-20 LAB — CBC WITH DIFFERENTIAL/PLATELET
Absolute Monocytes: 418 cells/uL (ref 200–950)
Basophils Absolute: 19 cells/uL (ref 0–200)
Basophils Relative: 0.5 %
EOS PCT: 1.8 %
Eosinophils Absolute: 68 cells/uL (ref 15–500)
HEMATOCRIT: 33.9 % — AB (ref 35.0–45.0)
HEMOGLOBIN: 11.5 g/dL — AB (ref 11.7–15.5)
LYMPHS ABS: 1566 {cells}/uL (ref 850–3900)
MCH: 30.7 pg (ref 27.0–33.0)
MCHC: 33.9 g/dL (ref 32.0–36.0)
MCV: 90.6 fL (ref 80.0–100.0)
MONOS PCT: 11 %
MPV: 10.6 fL (ref 7.5–12.5)
NEUTROS ABS: 1729 {cells}/uL (ref 1500–7800)
Neutrophils Relative %: 45.5 %
Platelets: 279 10*3/uL (ref 140–400)
RBC: 3.74 10*6/uL — AB (ref 3.80–5.10)
RDW: 13.6 % (ref 11.0–15.0)
Total Lymphocyte: 41.2 %
WBC: 3.8 10*3/uL (ref 3.8–10.8)

## 2018-08-20 LAB — COMPLETE METABOLIC PANEL WITH GFR
AG Ratio: 2.1 (calc) (ref 1.0–2.5)
ALT: 37 U/L — AB (ref 6–29)
AST: 34 U/L (ref 10–35)
Albumin: 4.6 g/dL (ref 3.6–5.1)
Alkaline phosphatase (APISO): 49 U/L (ref 33–130)
BUN: 9 mg/dL (ref 7–25)
CO2: 29 mmol/L (ref 20–32)
Calcium: 9.5 mg/dL (ref 8.6–10.4)
Chloride: 96 mmol/L — ABNORMAL LOW (ref 98–110)
Creat: 0.59 mg/dL (ref 0.50–1.05)
GFR, EST AFRICAN AMERICAN: 116 mL/min/{1.73_m2} (ref >=60)
GFR, Est Non African American: 100 mL/min/{1.73_m2} (ref >=60)
GLUCOSE: 84 mg/dL (ref 65–99)
Globulin: 2.2 g/dL (calc) (ref 1.9–3.7)
Potassium: 4.5 mmol/L (ref 3.5–5.3)
Sodium: 133 mmol/L — ABNORMAL LOW (ref 135–146)
TOTAL PROTEIN: 6.8 g/dL (ref 6.1–8.1)
Total Bilirubin: 0.4 mg/dL (ref 0.2–1.2)

## 2018-08-20 NOTE — Progress Notes (Signed)
ALT mildly elevated.  Labs are stable.

## 2018-08-22 NOTE — Progress Notes (Signed)
Office Visit Note  Patient: Mary Lane             Date of Birth: 1960/11/30           MRN: [id number]             PCP: Lynn Ito, MD Referring: Lynn Ito, MD Visit Date: 09/05/2018 Occupation: @GUAROCC @  Subjective:  Neck pain    History of Present Illness: Mary Lane is a 58 y.o. female with history of seropositive rheumatoid arthritis, osteoarthritis, fibromyalgia, and DDD.  Patient is on Plaquenil 200 mg by mouth twice daily Monday through Friday, methotrexate 5 tablets by mouth once weekly and folic acid 2 mg by mouth daily.  She has been having increased pain in both hands and both feet.  She reports intermittent joint swelling in both hands and both feet.  She continues to have chronic pain in both knee joints. She has not missed any doses of MTX or PLQ.  She continues to have chronic pain.  She has been evaluated by Dr. Raynald Kemp.  She has been having trapezius muscle spasms.  She continues to take Robaxin 500 mg BID and Flexeril 10 mg 1 tablet by mouth at bedtime for muscle spasms.  She has tried using OTC patches for pain relief. She continues to take Tramadol for pain relief. She has been taking 1-2 tablets BID PRN for pain relief. She has been having very interrupted sleep at night due to the discomfort she is experiencing.  She has had worsening fatigue due to not sleeping as well and being unable to exercise due to her neck pain.   Activities of Daily Living:  Patient reports morning stiffness for 30-60 minutes.   Patient Reports nocturnal pain.  Difficulty dressing/grooming: Denies Difficulty climbing stairs: Denies Difficulty getting out of chair: Denies Difficulty using hands for taps, buttons, cutlery, and/or writing: Reports  Review of Systems  Constitutional: Positive for fatigue.  HENT: Positive for mouth dryness and nose dryness. Negative for mouth sores, trouble swallowing and trouble swallowing.   Eyes: Positive for dryness. Negative for pain, redness and  visual disturbance.  Respiratory: Negative for cough, hemoptysis, shortness of breath, wheezing and difficulty breathing.   Cardiovascular: Negative for chest pain, palpitations, hypertension and swelling in legs/feet.  Gastrointestinal: Negative for blood in stool, constipation and diarrhea.  Endocrine: Negative for increased urination.  Genitourinary: Negative for painful urination and pelvic pain.  Musculoskeletal: Positive for arthralgias, joint pain, joint swelling and morning stiffness. Negative for myalgias, muscle weakness, muscle tenderness and myalgias.  Skin: Negative for color change, pallor, rash, hair loss, nodules/bumps, redness, skin tightness, ulcers and sensitivity to sunlight.  Allergic/Immunologic: Negative for susceptible to infections.  Neurological: Negative for dizziness, light-headedness, numbness and memory loss.  Hematological: Negative for swollen glands.  Psychiatric/Behavioral: Negative for depressed mood, confusion and sleep disturbance. The patient is not nervous/anxious.     PMFS History:  Patient Active Problem List   Diagnosis Date Noted  . Rheumatoid arthritis with rheumatoid factor of multiple sites without organ or systems involvement (HCC) 09/25/2017  . Raynaud's syndrome without gangrene 09/25/2017  . Medication monitoring encounter 09/25/2017  . Primary osteoarthritis of both hands 09/25/2017  . Primary osteoarthritis of both knees 09/25/2017  . DDD (degenerative disc disease), lumbar 09/25/2017  . Primary insomnia 09/25/2017  . Fibromyalgia 09/25/2017  . Mouth sores 09/25/2017  . Essential hypertension 09/25/2017  . History of gastroesophageal reflux (GERD) 09/25/2017  . History of depression 09/25/2017  . Vitamin D deficiency 09/25/2017  .  History of hypercholesterolemia 09/25/2017  . High risk medication use 09/06/2016    History reviewed. No pertinent past medical history.  Family History  Problem Relation Age of Onset  . Hypertension  Mother   . Prostate cancer Father   . Hypertension Father   . Hypertension Sister   . Hypercholesterolemia Sister   . Bipolar disorder Sister   . Depression Sister   . Healthy Daughter   . Healthy Son    Past Surgical History:  Procedure Laterality Date  . ABDOMINAL HYSTERECTOMY    . APPENDECTOMY    . CARPAL TUNNEL RELEASE Bilateral   . CHOLECYSTECTOMY    . ERCP    . KNEE SURGERY Bilateral   . TUBAL LIGATION     Social History   Social History Narrative  . Not on file    Objective: Vital Signs: BP 113/70 (BP Location: Left Arm, Patient Position: Sitting, Cuff Size: Normal)   Pulse 77   Resp 14   Ht 5\' 4"  (1.626 m)   Wt 133 lb 3.2 oz (60.4 kg)   BMI 22.86 kg/m    Physical Exam Vitals signs and nursing note reviewed.  Constitutional:      Appearance: She is well-developed.  HENT:     Head: Normocephalic and atraumatic.  Eyes:     Conjunctiva/sclera: Conjunctivae normal.  Neck:     Musculoskeletal: Normal range of motion.  Cardiovascular:     Rate and Rhythm: Normal rate and regular rhythm.     Heart sounds: Normal heart sounds.  Pulmonary:     Effort: Pulmonary effort is normal.     Breath sounds: Normal breath sounds.  Abdominal:     General: Bowel sounds are normal.     Palpations: Abdomen is soft.  Lymphadenopathy:     Cervical: No cervical adenopathy.  Skin:    General: Skin is warm and dry.     Capillary Refill: Capillary refill takes less than 2 seconds.  Neurological:     Mental Status: She is alert and oriented to person, place, and time.  Psychiatric:        Behavior: Behavior normal.      Musculoskeletal Exam: C-spine limited ROM.  Trapezius muscle spasms.  Thoracic and lumbar spine good ROM. Shoulder joints, elbow joints, wrist joints, MCPs, PIPs, and DIPs good ROM with no synovitis.  PIP and DIP synovial thickening consistent with osteoarthritis of both hands.  Hip joints, knee joints, ankle joints, MTPs, PIPs, and DIPs good ROM with no  synovitis.  No warmth or effusion of knee joints.  No tenderness or swelling of ankle joints.  No tenderness over trochanteric bursa bilaterally.    CDAI Exam: CDAI Score: Not documented Patient Global Assessment: Not documented; Provider Global Assessment: Not documented Swollen: 0 ; Tender: 3  Joint Exam      Right  Left  MCP 2   Tender   Tender  MCP 3      Tender     Investigation: No additional findings.  Imaging: No results found.  Recent Labs: Lab Results  Component Value Date   WBC 3.8 08/19/2018   HGB 11.5 (L) 08/19/2018   PLT 279 08/19/2018   NA 133 (L) 08/19/2018   K 4.5 08/19/2018   CL 96 (L) 08/19/2018   CO2 29 08/19/2018   GLUCOSE 84 08/19/2018   BUN 9 08/19/2018   CREATININE 0.59 08/19/2018   BILITOT 0.4 08/19/2018   ALKPHOS 52 08/28/2016   AST 34 08/19/2018   ALT  37 (H) 08/19/2018   PROT 6.8 08/19/2018   ALBUMIN 4.6 08/28/2016   CALCIUM 9.5 08/19/2018   GFRAA 116 08/19/2018    Speciality Comments: PLQ Eye Exam: 10/22/17 WNL   Procedures:  No procedures performed Allergies: Baclofen; Gabapentin; Ivp dye [iodinated diagnostic agents]; and Sulfa antibiotics   Assessment / Plan:     Visit Diagnoses: Rheumatoid arthritis with rheumatoid factor of multiple sites without organ or systems involvement (HCC) - +RF,+ANA: She has no synovitis on exam.  She has been having increased pain in both hands and both feet likely due to osteoarthritis.  She is clinically doing well on PLQ 200 mg BID M-F, MTX 5 tablets po once weekly, and folic acid 2 mg po daily.  She has not missed any doses of these medications recently. She will continue on this current treatment regimen.  She does not need any refills at this time.  She was advised to notify us if she develops increased joint pain or joint swelling.  She will follow up in 5 months.    High risk medication use - Plaquenil 200 mg twice daily Monday through Friday, methotrexate 5 tablets weekly, folic acid 2 mg daily.   Last Plaquenil eye exam normal on 10/22/2017.  Most recent CBC/CMP stable except for mildly elevated ALT on 08/19/2018.  Will monitor every 3 months and standing orders are in place. She has not had any recent infections.   Medication monitoring encounter - tramadol. UDS and narcotic agreement were updated today 09/05/18.- Plan: Pain Mgmt, Profile 5 w/Conf, U, Pain Mgmt, Tramadol w/medMATCH, U  Primary osteoarthritis of both hands: She has PIP and DIP synovial thickening consistent with osteoarthritis.  Joint protection and muscle strengthening were discussed.   Primary osteoarthritis of both knees: Chronic pain.  No warmth or effusion of knee joints.  She has good ROM on exam.   Raynaud's syndrome without gangrene: She has intermittent symptoms of Raynaud's.  She has no digital ulcerations or signs of gangrene.   Fibromyalgia: She continues to have generalized muscle aches and muscle tenderness.  She has been having trapezius muscle spasms and muscle tension.  She has been taking Robaxin 500 mg BID and flexeril 10 mg po at bedtime for muscle spasms.   DDD (degenerative disc disease), lumbar - s/p fusion: She has no lower back pain at this time.    Primary insomnia: She has been experiencing interrupted sleep at night due to the discomfort she has been experiencing in her neck.    DDD (degenerative disc disease), cervical-Chronic pain.  She has limited ROM on exam.She is not having symptoms of radiculopathy or evidence of myelopathy at this time. She has been having frequent and severe trapezius muscle spasms, which she has been treating with Robaxin 500 mg BID and Flexeril 10 mg po at bedtime.  She was evaluated by Dr. Raynald Kemp and his PA-C Skip Estimable with neurosurgery.  She was referred to PT, which did not improve her symptoms.  She as advised to return on a PRN basis to Dr. Raynald Kemp.    She has been taking tramadol 50 mg 1-2 tablets BID PRN for pain relief.  She was advised to follow back up with Dr.  Raynald Kemp since her pain has persisted and is interfering with daily life.   Other medical conditions are listed as follows:   History of depression  History of vitamin D deficiency  History of hypertension  History of gastroesophageal reflux (GERD)  History of hypercholesterolemia   Orders:  Orders Placed This Encounter  Procedures  . Pain Mgmt, Profile 5 w/Conf, U  . Pain Mgmt, Tramadol w/medMATCH, U   No orders of the defined types were placed in this encounter.    Follow-Up Instructions: Return in about 5 months (around 02/04/2019) for Rheumatoid arthritis, DDD.   Gearldine Bienenstock, PA-C   I examined and evaluated the patient with Sherron Ales PA.  Patient continues to have discomfort in her cervical spine.  She will follow-up with the neurosurgeon.  She is been also experiencing pain in her hands and feet.  No synovitis was noted.  I believe the discomfort is coming from underlying osteoarthritis.  She will continue on the current dose of methotrexate and Plaquenil for now.The plan of care was discussed as noted above.  Pollyann Savoy, MD  Note - This record has been created using Animal nutritionist.  Chart creation errors have been sought, but may not always  have been located. Such creation errors do not reflect on  the standard of medical care.

## 2018-08-26 ENCOUNTER — Other Ambulatory Visit: Payer: Self-pay | Admitting: *Deleted

## 2018-08-26 MED ORDER — METHOTREXATE 2.5 MG PO TABS: ORAL_TABLET | ORAL | 0 refills | Status: DC

## 2018-08-26 NOTE — Telephone Encounter (Signed)
Refill request recieved via fax   Last Visit: 04/01/18 Next Visit: 09/05/18 Labs: 08/19/18 ALT mildly elevated. Labs are stable.  Okay to refill per Dr. Corliss Skains

## 2018-08-28 ENCOUNTER — Other Ambulatory Visit: Payer: Self-pay | Admitting: Rheumatology

## 2018-08-29 NOTE — Telephone Encounter (Signed)
Last Visit: 04/01/18 Next Visit: 09/05/18  Okay to refill per Dr. Corliss Skains

## 2018-09-05 ENCOUNTER — Encounter: Payer: Self-pay | Admitting: Rheumatology

## 2018-09-05 ENCOUNTER — Ambulatory Visit (INDEPENDENT_AMBULATORY_CARE_PROVIDER_SITE_OTHER): Payer: Medicare Other | Admitting: Rheumatology

## 2018-09-05 VITALS — BP 113/70 | HR 77 | Resp 14 | Ht 64.0 in | Wt 133.2 lb

## 2018-09-05 DIAGNOSIS — Z8719 Personal history of other diseases of the digestive system: Secondary | ICD-10-CM

## 2018-09-05 DIAGNOSIS — I7300 Raynaud's syndrome without gangrene: Secondary | ICD-10-CM

## 2018-09-05 DIAGNOSIS — Z5181 Encounter for therapeutic drug level monitoring: Secondary | ICD-10-CM

## 2018-09-05 DIAGNOSIS — Z8679 Personal history of other diseases of the circulatory system: Secondary | ICD-10-CM

## 2018-09-05 DIAGNOSIS — Z8659 Personal history of other mental and behavioral disorders: Secondary | ICD-10-CM

## 2018-09-05 DIAGNOSIS — Z8639 Personal history of other endocrine, nutritional and metabolic disease: Secondary | ICD-10-CM

## 2018-09-05 DIAGNOSIS — M5136 Other intervertebral disc degeneration, lumbar region: Secondary | ICD-10-CM

## 2018-09-05 DIAGNOSIS — M0579 Rheumatoid arthritis with rheumatoid factor of multiple sites without organ or systems involvement: Secondary | ICD-10-CM

## 2018-09-05 DIAGNOSIS — M797 Fibromyalgia: Secondary | ICD-10-CM

## 2018-09-05 DIAGNOSIS — F5101 Primary insomnia: Secondary | ICD-10-CM

## 2018-09-05 DIAGNOSIS — M170 Bilateral primary osteoarthritis of knee: Secondary | ICD-10-CM

## 2018-09-05 DIAGNOSIS — M5030 Other cervical disc degeneration, unspecified cervical region: Secondary | ICD-10-CM

## 2018-09-05 DIAGNOSIS — Z79899 Other long term (current) drug therapy: Secondary | ICD-10-CM | POA: Diagnosis not present

## 2018-09-05 DIAGNOSIS — M19041 Primary osteoarthritis, right hand: Secondary | ICD-10-CM

## 2018-09-05 DIAGNOSIS — M19042 Primary osteoarthritis, left hand: Secondary | ICD-10-CM

## 2018-09-05 NOTE — Patient Instructions (Signed)
*  Your next Plaquenil eye exam is due in March.  Please have your ophthalmologist fax [place] the results of your exam to [phone]*

## 2018-09-08 LAB — PAIN MGMT, PROFILE 5 W/CONF, U
AMPHETAMINES: NEGATIVE ng/mL (ref <500)
BARBITURATES: NEGATIVE ng/mL (ref <300)
BENZODIAZEPINES: NEGATIVE ng/mL (ref <100)
COCAINE METABOLITE: NEGATIVE ng/mL (ref <150)
CREATININE: 41.8 mg/dL
Marijuana Metabolite: NEGATIVE ng/mL (ref <20)
Methadone Metabolite: NEGATIVE ng/mL (ref <100)
OXIDANT: NEGATIVE ug/mL (ref <200)
Opiates: NEGATIVE ng/mL (ref <100)
Oxycodone: NEGATIVE ng/mL (ref <100)
pH: 7.4 (ref 4.5–9.0)

## 2018-09-08 LAB — PAIN MGMT, TRAMADOL W/MEDMATCH, U: DESMETHYLTRAMADOL: 1777 ng/mL — AB (ref <100)

## 2018-09-09 NOTE — Progress Notes (Signed)
UDS is consistent with treatment.

## 2018-09-23 ENCOUNTER — Other Ambulatory Visit: Payer: Self-pay | Admitting: Rheumatology

## 2018-09-23 NOTE — Telephone Encounter (Signed)
Last Visit: 09/05/18 Next visit: 02/06/19  Okay to refill per Dr. Deveshwar  

## 2018-09-24 ENCOUNTER — Other Ambulatory Visit: Payer: Self-pay | Admitting: *Deleted

## 2018-09-24 MED ORDER — TRAMADOL HCL 50 MG PO TABS: ORAL_TABLET | ORAL | 0 refills | Status: DC

## 2018-09-24 NOTE — Telephone Encounter (Signed)
ok 

## 2018-09-24 NOTE — Telephone Encounter (Signed)
Last Visit: 09/05/18 Next visit: 02/06/19 UDS: 09/05/18 Narc Agreement: 09/05/18  Okay to refill Tramadol?

## 2018-10-11 ENCOUNTER — Other Ambulatory Visit: Payer: Self-pay | Admitting: Rheumatology

## 2018-10-11 NOTE — Telephone Encounter (Signed)
Last Visit: 09/05/18 Next visit: 02/06/19 Labs: 08/19/18 ALT mildly elevated. Labs are stable. PLQ Eye Exam: 10/22/17 WNL   Okay to refill per Dr. Corliss Skains

## 2018-11-19 ENCOUNTER — Other Ambulatory Visit: Payer: Self-pay | Admitting: Rheumatology

## 2018-11-19 NOTE — Telephone Encounter (Signed)
Last Visit: 09/05/18 Next visit: 02/06/19 Labs: 08/19/18 ALT mildly elevated. Labs are stable.  Okay to refill per Dr. Corliss Skains

## 2018-11-26 ENCOUNTER — Other Ambulatory Visit: Payer: Self-pay | Admitting: Rheumatology

## 2018-11-27 NOTE — Telephone Encounter (Signed)
Last Visit: 09/05/2018 Next Visit: 02/06/2019  Okay to refill per Dr. Corliss Skains.

## 2018-12-26 ENCOUNTER — Other Ambulatory Visit: Payer: Self-pay | Admitting: Rheumatology

## 2018-12-26 NOTE — Telephone Encounter (Signed)
Last Visit: 09/05/18 Next visit: 02/06/19  Okay to refill per Dr. Corliss Skains

## 2018-12-31 ENCOUNTER — Other Ambulatory Visit: Payer: Self-pay | Admitting: *Deleted

## 2018-12-31 MED ORDER — TRAMADOL HCL 50 MG PO TABS: ORAL_TABLET | ORAL | 0 refills | Status: DC

## 2018-12-31 NOTE — Telephone Encounter (Signed)
Refill request received via fax   Last Visit: 09/05/18 Next visit: 02/06/19 UDS: 09/05/18 Narc Agreement: 09/05/18  Okay to refill Tramadol?

## 2018-12-31 NOTE — Telephone Encounter (Signed)
ok 

## 2019-01-03 ENCOUNTER — Other Ambulatory Visit: Payer: Self-pay | Admitting: *Deleted

## 2019-01-03 ENCOUNTER — Other Ambulatory Visit: Payer: Self-pay | Admitting: Rheumatology

## 2019-01-03 DIAGNOSIS — Z79899 Other long term (current) drug therapy: Secondary | ICD-10-CM

## 2019-01-03 DIAGNOSIS — M0579 Rheumatoid arthritis with rheumatoid factor of multiple sites without organ or systems involvement: Secondary | ICD-10-CM

## 2019-01-03 NOTE — Telephone Encounter (Signed)
Last Visit: 09/05/18 Next visit: 02/06/19 Labs: 08/19/18 ALT mildly elevated. Labs are stable. PLQ Eye Exam: 10/22/17 WNL   Patient advised she is due to update PLQ eye exam. Patient will call to schedule PLQ eye exam. Appt had to be rescheduled due to COVID-19  Okay to refill per Dr. Corliss Skains

## 2019-01-04 LAB — COMPLETE METABOLIC PANEL WITH GFR
AG Ratio: 1.6 (calc) (ref 1.0–2.5)
ALT: 17 U/L (ref 6–29)
AST: 23 U/L (ref 10–35)
Albumin: 4.3 g/dL (ref 3.6–5.1)
Alkaline phosphatase (APISO): 51 U/L (ref 37–153)
BUN: 8 mg/dL (ref 7–25)
CO2: 29 mmol/L (ref 20–32)
Calcium: 9.6 mg/dL (ref 8.6–10.4)
Chloride: 93 mmol/L — ABNORMAL LOW (ref 98–110)
Creat: 0.54 mg/dL (ref 0.50–1.05)
GFR, Est African American: 120 mL/min/{1.73_m2} (ref >=60)
GFR, Est Non African American: 103 mL/min/{1.73_m2} (ref >=60)
Globulin: 2.7 g/dL (calc) (ref 1.9–3.7)
Glucose, Bld: 86 mg/dL (ref 65–99)
Potassium: 4.2 mmol/L (ref 3.5–5.3)
Sodium: 131 mmol/L — ABNORMAL LOW (ref 135–146)
Total Bilirubin: 0.4 mg/dL (ref 0.2–1.2)
Total Protein: 7 g/dL (ref 6.1–8.1)

## 2019-01-04 LAB — CBC WITH DIFFERENTIAL/PLATELET
Absolute Monocytes: 414 cells/uL (ref 200–950)
Basophils Absolute: 19 cells/uL (ref 0–200)
Basophils Relative: 0.5 %
Eosinophils Absolute: 59 cells/uL (ref 15–500)
Eosinophils Relative: 1.6 %
HCT: 34.5 % — ABNORMAL LOW (ref 35.0–45.0)
Hemoglobin: 11.6 g/dL — ABNORMAL LOW (ref 11.7–15.5)
Lymphs Abs: 1018 cells/uL (ref 850–3900)
MCH: 30.6 pg (ref 27.0–33.0)
MCHC: 33.6 g/dL (ref 32.0–36.0)
MCV: 91 fL (ref 80.0–100.0)
MPV: 10.5 fL (ref 7.5–12.5)
Monocytes Relative: 11.2 %
Neutro Abs: 2190 cells/uL (ref 1500–7800)
Neutrophils Relative %: 59.2 %
Platelets: 257 10*3/uL (ref 140–400)
RBC: 3.79 10*6/uL — ABNORMAL LOW (ref 3.80–5.10)
RDW: 13.5 % (ref 11.0–15.0)
Total Lymphocyte: 27.5 %
WBC: 3.7 10*3/uL — ABNORMAL LOW (ref 3.8–10.8)

## 2019-01-07 NOTE — Progress Notes (Signed)
Labs are stable.  WBC count is low.  We will continue to monitor.  Repeat labs in 3 months.

## 2019-01-28 NOTE — Progress Notes (Deleted)
Office Visit Note  Patient: Mary Lane             Date of Birth: 11/28/1960           MRN: [id number]             PCP: Lynn ItoSmith, Lori, MD Referring: Lynn ItoSmith, Lori, MD Visit Date: 02/06/2019 Occupation: @GUAROCC @  Subjective:  No chief complaint on file.   Methotrexate 2.5 mg 5 tablets every 7 days, folic acid 1 mg 2 tablets daily, and Plaquenil 200 mg 1 tablet twice daily Monday through Friday only.  Last Plaquenil eye exam normal on 10/22/2017.  Most recent CBC/CMP stable except for low WBC count on 01/03/2019.  Due for CBC/CMP in August and will monitor every 3 months.  Standing orders are in place.  She has received the Zostavax vaccine.  Recommend annual influenza, Pneumovax 23, Prevnar 13, and Shingrix as indicated.   History of Present Illness: Mary Lane is a 58 y.o. female ***   Activities of Daily Living:  Patient reports morning stiffness for *** {minute/hour:19697}.   Patient {ACTIONS;DENIES/REPORTS:21021675::"Denies"} nocturnal pain.  Difficulty dressing/grooming: {ACTIONS;DENIES/REPORTS:21021675::"Denies"} Difficulty climbing stairs: {ACTIONS;DENIES/REPORTS:21021675::"Denies"} Difficulty getting out of chair: {ACTIONS;DENIES/REPORTS:21021675::"Denies"} Difficulty using hands for taps, buttons, cutlery, and/or writing: {ACTIONS;DENIES/REPORTS:21021675::"Denies"}  No Rheumatology ROS completed.   PMFS History:  Patient Active Problem List   Diagnosis Date Noted  . Rheumatoid arthritis with rheumatoid factor of multiple sites without organ or systems involvement (HCC) 09/25/2017  . Raynaud's syndrome without gangrene 09/25/2017  . Medication monitoring encounter 09/25/2017  . Primary osteoarthritis of both hands 09/25/2017  . Primary osteoarthritis of both knees 09/25/2017  . DDD (degenerative disc disease), lumbar 09/25/2017  . Primary insomnia 09/25/2017  . Fibromyalgia 09/25/2017  . Mouth sores 09/25/2017  . Essential hypertension 09/25/2017  . History of  gastroesophageal reflux (GERD) 09/25/2017  . History of depression 09/25/2017  . Vitamin D deficiency 09/25/2017  . History of hypercholesterolemia 09/25/2017  . High risk medication use 09/06/2016    No past medical history on file.  Family History  Problem Relation Age of Onset  . Hypertension Mother   . Prostate cancer Father   . Hypertension Father   . Hypertension Sister   . Hypercholesterolemia Sister   . Bipolar disorder Sister   . Depression Sister   . Healthy Daughter   . Healthy Son    Past Surgical History:  Procedure Laterality Date  . ABDOMINAL HYSTERECTOMY    . APPENDECTOMY    . CARPAL TUNNEL RELEASE Bilateral   . CHOLECYSTECTOMY    . ERCP    . KNEE SURGERY Bilateral   . TUBAL LIGATION     Social History   Social History Narrative  . Not on file   Immunization History  Administered Date(s) Administered  . Zoster 09/02/2013     Objective: Vital Signs: There were no vitals taken for this visit.   Physical Exam   Musculoskeletal Exam: ***  CDAI Exam: CDAI Score: - Patient Global: -; Provider Global: - Swollen: -; Tender: - Joint Exam   No joint exam has been documented for this visit   There is currently no information documented on the homunculus. Go to the Rheumatology activity and complete the homunculus joint exam.  Investigation: No additional findings.  Imaging: No results found.  Recent Labs: Lab Results  Component Value Date   WBC 3.7 (L) 01/03/2019   HGB 11.6 (L) 01/03/2019   PLT 257 01/03/2019   NA 131 (L) 01/03/2019   K 4.2  01/03/2019   CL 93 (L) 01/03/2019   CO2 29 01/03/2019   GLUCOSE 86 01/03/2019   BUN 8 01/03/2019   CREATININE 0.54 01/03/2019   BILITOT 0.4 01/03/2019   ALKPHOS 52 08/28/2016   AST 23 01/03/2019   ALT 17 01/03/2019   PROT 7.0 01/03/2019   ALBUMIN 4.6 08/28/2016   CALCIUM 9.6 01/03/2019   GFRAA 120 01/03/2019    Speciality Comments: PLQ Eye Exam: 10/22/17 WNL   Procedures:  No procedures  performed Allergies: Baclofen, Gabapentin, Ivp dye [iodinated diagnostic agents], and Sulfa antibiotics   Assessment / Plan:     Visit Diagnoses: No diagnosis found.   Orders: No orders of the defined types were placed in this encounter.  No orders of the defined types were placed in this encounter.   Face-to-face time spent with patient was *** minutes. Greater than 50% of time was spent in counseling and coordination of care.  Follow-Up Instructions: No follow-ups on file.   Ofilia Neas, PA-C  Note - This record has been created using Dragon software.  Chart creation errors have been sought, but may not always  have been located. Such creation errors do not reflect on  the standard of medical care.

## 2019-02-06 ENCOUNTER — Ambulatory Visit: Payer: Self-pay | Admitting: Rheumatology

## 2019-02-17 NOTE — Progress Notes (Signed)
Office Visit Note  Patient: Mary Lane             Date of Birth: 12/04/1960           MRN: [id number]             PCP: Lynn ItoSmith, Lori, MD Referring: Lynn ItoSmith, Lori, MD Visit Date: 02/18/2019 Occupation: @GUAROCC @  Subjective:  Increased pain in joints.   History of Present Illness: Mary Lane is a 58 y.o. female with history of rheumatoid arthritis, osteoarthritis and fibromyalgia syndrome.  She states for the last 2 months she has been experiencing increased pain in her joints.  She describes pain in her bilateral hands and bilateral feet.  She feels tightness in the costochondral muscles.  She also has some muscle spasms in her lower extremities.  Activities of Daily Living:  Patient reports morning stiffness for several hours.   Patient Reports nocturnal pain.  Difficulty dressing/grooming: Denies Difficulty climbing stairs: Denies Difficulty getting out of chair: Denies Difficulty using hands for taps, buttons, cutlery, and/or writing: Reports  Review of Systems  Constitutional: Positive for fatigue. Negative for night sweats, weight gain and weight loss.  HENT: Positive for mouth dryness. Negative for mouth sores, trouble swallowing, trouble swallowing and nose dryness.   Eyes: Positive for dryness. Negative for pain, redness, itching and visual disturbance.  Respiratory: Negative for cough, shortness of breath and difficulty breathing.   Cardiovascular: Negative for chest pain, palpitations, hypertension, irregular heartbeat and swelling in legs/feet.  Gastrointestinal: Negative for abdominal pain, blood in stool, constipation and diarrhea.  Endocrine: Negative for increased urination.  Genitourinary: Negative for painful urination, pelvic pain and vaginal dryness.  Musculoskeletal: Positive for arthralgias, joint pain, joint swelling and morning stiffness. Negative for myalgias, muscle weakness, muscle tenderness and myalgias.  Skin: Negative for color change, rash, hair  loss, redness, skin tightness, ulcers and sensitivity to sunlight.  Allergic/Immunologic: Negative for susceptible to infections.  Neurological: Positive for weakness. Negative for dizziness, light-headedness, headaches, memory loss and night sweats.  Hematological: Negative for bruising/bleeding tendency and swollen glands.  Psychiatric/Behavioral: Positive for sleep disturbance. Negative for depressed mood and confusion. The patient is not nervous/anxious.     PMFS History:  Patient Active Problem List   Diagnosis Date Noted  . Rheumatoid arthritis with rheumatoid factor of multiple sites without organ or systems involvement (HCC) 09/25/2017  . Raynaud's syndrome without gangrene 09/25/2017  . Medication monitoring encounter 09/25/2017  . Primary osteoarthritis of both hands 09/25/2017  . Primary osteoarthritis of both knees 09/25/2017  . DDD (degenerative disc disease), lumbar 09/25/2017  . Primary insomnia 09/25/2017  . Fibromyalgia 09/25/2017  . Mouth sores 09/25/2017  . Essential hypertension 09/25/2017  . History of gastroesophageal reflux (GERD) 09/25/2017  . History of depression 09/25/2017  . Vitamin D deficiency 09/25/2017  . History of hypercholesterolemia 09/25/2017  . High risk medication use 09/06/2016    History reviewed. No pertinent past medical history.  Family History  Problem Relation Age of Onset  . Hypertension Mother   . Prostate cancer Father   . Hypertension Father   . Hypertension Sister   . Hypercholesterolemia Sister   . Bipolar disorder Sister   . Depression Sister   . Healthy Daughter   . Healthy Son    Past Surgical History:  Procedure Laterality Date  . ABDOMINAL HYSTERECTOMY    . APPENDECTOMY    . CARPAL TUNNEL RELEASE Bilateral   . CHOLECYSTECTOMY    . ERCP    . KNEE SURGERY  Bilateral   . TUBAL LIGATION     Social History   Social History Narrative  . Not on file   Immunization History  Administered Date(s) Administered  .  Zoster 09/02/2013     Objective: Vital Signs: BP 121/77 (BP Location: Left Arm, Patient Position: Sitting, Cuff Size: Normal)   Pulse 83   Resp 12   Ht 5\' 4"  (1.626 m)   Wt 135 lb 12.8 oz (61.6 kg)   BMI 23.31 kg/m    Physical Exam Vitals signs and nursing note reviewed.  Constitutional:      Appearance: She is well-developed.  HENT:     Head: Normocephalic and atraumatic.  Eyes:     Conjunctiva/sclera: Conjunctivae normal.  Neck:     Musculoskeletal: Normal range of motion.  Cardiovascular:     Rate and Rhythm: Normal rate and regular rhythm.     Heart sounds: Normal heart sounds.  Pulmonary:     Effort: Pulmonary effort is normal.     Breath sounds: Normal breath sounds.  Abdominal:     General: Bowel sounds are normal.     Palpations: Abdomen is soft.  Lymphadenopathy:     Cervical: No cervical adenopathy.  Skin:    General: Skin is warm and dry.     Capillary Refill: Capillary refill takes less than 2 seconds.  Neurological:     Mental Status: She is alert and oriented to person, place, and time.  Psychiatric:        Behavior: Behavior normal.      Musculoskeletal Exam: C-spine and lumbar spine  spine was in limited range of motion.  She has some discomfort range of motion of her lumbar spine.  Shoulder joints, elbow joints with good range of motion.  She has bilateral MCP PIP and DIP thickening with no synovitis.  Hip joints knee joints ankles MTPs PIPs with good range of motion.  She has positive tender points and hyperalgesia.  CDAI Exam: CDAI Score: 0.8  Patient Global: 5 mm; Provider Global: 3 mm Swollen: 0 ; Tender: 0  Joint Exam   No joint exam has been documented for this visit   There is currently no information documented on the homunculus. Go to the Rheumatology activity and complete the homunculus joint exam.  Investigation: No additional findings.  Imaging: No results found.  Recent Labs: Lab Results  Component Value Date   WBC 3.7 (L)  01/03/2019   HGB 11.6 (L) 01/03/2019   PLT 257 01/03/2019   NA 131 (L) 01/03/2019   K 4.2 01/03/2019   CL 93 (L) 01/03/2019   CO2 29 01/03/2019   GLUCOSE 86 01/03/2019   BUN 8 01/03/2019   CREATININE 0.54 01/03/2019   BILITOT 0.4 01/03/2019   ALKPHOS 52 08/28/2016   AST 23 01/03/2019   ALT 17 01/03/2019   PROT 7.0 01/03/2019   ALBUMIN 4.6 08/28/2016   CALCIUM 9.6 01/03/2019   GFRAA 120 01/03/2019    Speciality Comments: PLQ Eye Exam: 10/22/17 WNL   Procedures:  No procedures performed Allergies: Baclofen, Gabapentin, Ivp dye [iodinated diagnostic agents], and Sulfa antibiotics   Assessment / Plan:     Visit Diagnoses: Rheumatoid arthritis with rheumatoid factor 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  I did not notice any synovitis on examination.  Plan: Sedimentation rate,   High risk medication use -  Plaquenil 200 mg twice daily Monday through Friday, methotrexate 5  tablets weekly, folic acid 2 mg daily.  Last Plaquenil eye exam normal on 10/22/2017.  I plan to continue current treatment.- Plan: Pain Mgmt, Profile 5 w/Conf, U, Pain Mgmt, Tramadol w/medMATCH, U, COMPLETE METABOLIC PANEL WITH GFR, Cyclic citrul peptide antibody, IgG,   Primary osteoarthritis of both hands - Plan: Joint protection muscle strengthening was discussed.  Primary osteoarthritis of both knees - Plan: She continues to have some discomfort.  I do not see any warmth or swelling.  Exercises were emphasized.  Raynaud's syndrome without gangrene -currently not active.  Fibromyalgia - Robaxin and tramadol -she continues to have generalized pain and discomfort.  DDD (degenerative disc disease), cervical -range of motion exercises were emphasized.  DDD (degenerative disc disease), lumbar - S/p fusion -she has some discomfort range of motion.  Primary insomnia -  History of depression -  History of vitamin D  deficiency - Plan: She is on supplement.  History of hypertension   History of gastroesophageal reflux (GERD)   History of hypercholesterolemia -  Orders: Orders Placed This Encounter  Procedures  . Pain Mgmt, Profile 5 w/Conf, U  . Pain Mgmt, Tramadol w/medMATCH, U  . COMPLETE METABOLIC PANEL WITH GFR  . Sedimentation rate  . Cyclic citrul peptide antibody, IgG   No orders of the defined types were placed in this encounter.    Follow-Up Instructions: Return in about 5 months (around 07/21/2019) for Rheumatoid arthritis, Osteoarthritis, FMS.   Bo Merino, MD  Note - This record has been created using Editor, commissioning.  Chart creation errors have been sought, but may not always  have been located. Such creation errors do not reflect on  the standard of medical care.

## 2019-02-18 ENCOUNTER — Other Ambulatory Visit: Payer: Self-pay

## 2019-02-18 ENCOUNTER — Encounter: Payer: Self-pay | Admitting: Rheumatology

## 2019-02-18 ENCOUNTER — Ambulatory Visit (INDEPENDENT_AMBULATORY_CARE_PROVIDER_SITE_OTHER): Payer: Medicare Other | Admitting: Rheumatology

## 2019-02-18 VITALS — BP 121/77 | HR 83 | Resp 12 | Ht 64.0 in | Wt 135.8 lb

## 2019-02-18 DIAGNOSIS — M170 Bilateral primary osteoarthritis of knee: Secondary | ICD-10-CM | POA: Diagnosis not present

## 2019-02-18 DIAGNOSIS — Z8719 Personal history of other diseases of the digestive system: Secondary | ICD-10-CM

## 2019-02-18 DIAGNOSIS — Z8679 Personal history of other diseases of the circulatory system: Secondary | ICD-10-CM

## 2019-02-18 DIAGNOSIS — Z79899 Other long term (current) drug therapy: Secondary | ICD-10-CM | POA: Diagnosis not present

## 2019-02-18 DIAGNOSIS — M797 Fibromyalgia: Secondary | ICD-10-CM

## 2019-02-18 DIAGNOSIS — M19041 Primary osteoarthritis, right hand: Secondary | ICD-10-CM

## 2019-02-18 DIAGNOSIS — Z8659 Personal history of other mental and behavioral disorders: Secondary | ICD-10-CM

## 2019-02-18 DIAGNOSIS — F5101 Primary insomnia: Secondary | ICD-10-CM

## 2019-02-18 DIAGNOSIS — M5136 Other intervertebral disc degeneration, lumbar region: Secondary | ICD-10-CM

## 2019-02-18 DIAGNOSIS — I7300 Raynaud's syndrome without gangrene: Secondary | ICD-10-CM

## 2019-02-18 DIAGNOSIS — M0579 Rheumatoid arthritis with rheumatoid factor of multiple sites without organ or systems involvement: Secondary | ICD-10-CM

## 2019-02-18 DIAGNOSIS — M19042 Primary osteoarthritis, left hand: Secondary | ICD-10-CM

## 2019-02-18 DIAGNOSIS — Z8639 Personal history of other endocrine, nutritional and metabolic disease: Secondary | ICD-10-CM

## 2019-02-18 DIAGNOSIS — M5030 Other cervical disc degeneration, unspecified cervical region: Secondary | ICD-10-CM

## 2019-02-20 ENCOUNTER — Other Ambulatory Visit: Payer: Self-pay | Admitting: Rheumatology

## 2019-02-20 LAB — PAIN MGMT, PROFILE 5 W/CONF, U
Amphetamines: NEGATIVE ng/mL
Barbiturates: NEGATIVE ng/mL
Benzodiazepines: NEGATIVE ng/mL
Cocaine Metabolite: NEGATIVE ng/mL
Creatinine: 21.4 mg/dL
Marijuana Metabolite: NEGATIVE ng/mL
Methadone Metabolite: NEGATIVE ng/mL
Opiates: NEGATIVE ng/mL
Oxidant: NEGATIVE ug/mL
Oxycodone: NEGATIVE ng/mL
pH: 6.8 (ref 4.5–9.0)

## 2019-02-20 LAB — COMPLETE METABOLIC PANEL WITH GFR
AG Ratio: 1.7 (calc) (ref 1.0–2.5)
ALT: 22 U/L (ref 6–29)
AST: 27 U/L (ref 10–35)
Albumin: 4.7 g/dL (ref 3.6–5.1)
Alkaline phosphatase (APISO): 47 U/L (ref 37–153)
BUN: 7 mg/dL (ref 7–25)
CO2: 31 mmol/L (ref 20–32)
Calcium: 9.8 mg/dL (ref 8.6–10.4)
Chloride: 93 mmol/L — ABNORMAL LOW (ref 98–110)
Creat: 0.58 mg/dL (ref 0.50–1.05)
GFR, Est African American: 117 mL/min/{1.73_m2} (ref >=60)
GFR, Est Non African American: 101 mL/min/{1.73_m2} (ref >=60)
Globulin: 2.7 g/dL (calc) (ref 1.9–3.7)
Glucose, Bld: 93 mg/dL (ref 65–99)
Potassium: 3.7 mmol/L (ref 3.5–5.3)
Sodium: 132 mmol/L — ABNORMAL LOW (ref 135–146)
Total Bilirubin: 0.5 mg/dL (ref 0.2–1.2)
Total Protein: 7.4 g/dL (ref 6.1–8.1)

## 2019-02-20 LAB — SEDIMENTATION RATE: Sed Rate: 19 mm/h (ref 0–30)

## 2019-02-20 LAB — PAIN MGMT, TRAMADOL W/MEDMATCH, U
Desmethyltramadol: 926 ng/mL
Tramadol: >10000 ng/mL

## 2019-02-20 LAB — CYCLIC CITRUL PEPTIDE ANTIBODY, IGG: Cyclic Citrullin Peptide Ab: <16 UNITS

## 2019-02-20 NOTE — Telephone Encounter (Signed)
Last Visit: 02/18/19 Next Visit: 07/21/19 Labs: 02/18/19 CMP stable.  Okay to refill per Dr. Estanislado Pandy

## 2019-02-26 ENCOUNTER — Other Ambulatory Visit: Payer: Self-pay | Admitting: Rheumatology

## 2019-02-26 NOTE — Telephone Encounter (Signed)
Last Visit: 02/18/19 Next Visit: 07/21/19  Okay to refill per Dr. Estanislado Pandy

## 2019-03-29 ENCOUNTER — Other Ambulatory Visit: Payer: Self-pay | Admitting: Rheumatology

## 2019-03-29 DIAGNOSIS — M0579 Rheumatoid arthritis with rheumatoid factor of multiple sites without organ or systems involvement: Secondary | ICD-10-CM

## 2019-03-31 NOTE — Telephone Encounter (Signed)
Last Visit: 02/18/19 Next Visit: 07/21/19 Labs: 01/03/19 stable PLQ Eye Exam: 10/22/17 WNL   Patient reminded she is due to update her PLQ eye exam. Patient will call to schedule.  Okay to refill per Dr. Estanislado Pandy.

## 2019-04-02 ENCOUNTER — Other Ambulatory Visit: Payer: Self-pay | Admitting: *Deleted

## 2019-04-02 MED ORDER — TRAMADOL HCL 50 MG PO TABS: ORAL_TABLET | ORAL | 0 refills | Status: DC

## 2019-04-02 NOTE — Telephone Encounter (Signed)
Refill request received via fax  Last Visit: 02/18/19 Next Visit: 07/21/19 UDS: 02/18/19 Narc Agreement: 02/18/19  Okay to refill Tramadol?

## 2019-04-04 ENCOUNTER — Telehealth: Payer: Self-pay | Admitting: Rheumatology

## 2019-04-04 MED ORDER — MAGIC MOUTHWASH: ORAL | 0 refills | Status: DC

## 2019-04-04 NOTE — Telephone Encounter (Signed)
Patient states the mouth ulcers are a recurrent issue and she has now developed them in the front of her mouth. Patient states they generally heal fine, but the ulcers in the front of her mouth seem more aggravated. Patient is inquiring about what she can take to help the mouth ulcers.

## 2019-04-04 NOTE — Telephone Encounter (Signed)
Patient left a voicemail stating she has mouth ulcers and is requesting a return call to let her know what Dr. Estanislado Pandy recommends taking.

## 2019-04-04 NOTE — Telephone Encounter (Signed)
Please send in a prescription for magic mouthwash

## 2019-04-04 NOTE — Telephone Encounter (Signed)
Prescription sent to the pharmacy. patient has been notified.

## 2019-05-08 ENCOUNTER — Other Ambulatory Visit: Payer: Self-pay | Admitting: Rheumatology

## 2019-05-08 NOTE — Telephone Encounter (Signed)
Last Visit: 02/18/19 Next Visit: 07/21/19 Labs: 02/18/19 stable  Okay to refill per Dr. Estanislado Pandy

## 2019-05-20 ENCOUNTER — Other Ambulatory Visit: Payer: Self-pay

## 2019-05-20 DIAGNOSIS — Z79899 Other long term (current) drug therapy: Secondary | ICD-10-CM

## 2019-05-21 ENCOUNTER — Telehealth: Payer: Self-pay | Admitting: *Deleted

## 2019-05-21 DIAGNOSIS — Z79899 Other long term (current) drug therapy: Secondary | ICD-10-CM

## 2019-05-21 LAB — CBC WITH DIFFERENTIAL/PLATELET
Absolute Monocytes: 380 cells/uL (ref 200–950)
Basophils Absolute: 20 cells/uL (ref 0–200)
Basophils Relative: 0.7 %
Eosinophils Absolute: 70 cells/uL (ref 15–500)
Eosinophils Relative: 2.4 %
HCT: 32.9 % — ABNORMAL LOW (ref 35.0–45.0)
Hemoglobin: 10.9 g/dL — ABNORMAL LOW (ref 11.7–15.5)
Lymphs Abs: 1235 cells/uL (ref 850–3900)
MCH: 30.4 pg (ref 27.0–33.0)
MCHC: 33.1 g/dL (ref 32.0–36.0)
MCV: 91.9 fL (ref 80.0–100.0)
MPV: 10.7 fL (ref 7.5–12.5)
Monocytes Relative: 13.1 %
Neutro Abs: 1195 cells/uL — ABNORMAL LOW (ref 1500–7800)
Neutrophils Relative %: 41.2 %
Platelets: 247 10*3/uL (ref 140–400)
RBC: 3.58 10*6/uL — ABNORMAL LOW (ref 3.80–5.10)
RDW: 13.7 % (ref 11.0–15.0)
Total Lymphocyte: 42.6 %
WBC: 2.9 10*3/uL — ABNORMAL LOW (ref 3.8–10.8)

## 2019-05-21 LAB — COMPLETE METABOLIC PANEL WITH GFR
AG Ratio: 1.8 (calc) (ref 1.0–2.5)
ALT: 17 U/L (ref 6–29)
AST: 22 U/L (ref 10–35)
Albumin: 4.5 g/dL (ref 3.6–5.1)
Alkaline phosphatase (APISO): 43 U/L (ref 37–153)
BUN: 7 mg/dL (ref 7–25)
CO2: 31 mmol/L (ref 20–32)
Calcium: 9.8 mg/dL (ref 8.6–10.4)
Chloride: 93 mmol/L — ABNORMAL LOW (ref 98–110)
Creat: 0.6 mg/dL (ref 0.50–0.99)
GFR, Est African American: 115 mL/min/{1.73_m2} (ref >=60)
GFR, Est Non African American: 99 mL/min/{1.73_m2} (ref >=60)
Globulin: 2.5 g/dL (calc) (ref 1.9–3.7)
Glucose, Bld: 87 mg/dL (ref 65–99)
Potassium: 3.9 mmol/L (ref 3.5–5.3)
Sodium: 132 mmol/L — ABNORMAL LOW (ref 135–146)
Total Bilirubin: 0.3 mg/dL (ref 0.2–1.2)
Total Protein: 7 g/dL (ref 6.1–8.1)

## 2019-05-21 NOTE — Progress Notes (Signed)
Decrease methotrexate to 3 tablets p.o. weekly due to low WBC.  Patient had no synovitis on examination.  Repeat CBC in 3 weeks.

## 2019-05-21 NOTE — Telephone Encounter (Signed)
-----   Message from Bo Merino, MD sent at 05/21/2019  8:20 AM EDT ----- Decrease methotrexate to 3 tablets p.o. weekly due to low WBC.  Patient had no synovitis on examination.  Repeat CBC in 3 weeks.

## 2019-05-27 ENCOUNTER — Other Ambulatory Visit: Payer: Self-pay | Admitting: Rheumatology

## 2019-05-27 DIAGNOSIS — M0579 Rheumatoid arthritis with rheumatoid factor of multiple sites without organ or systems involvement: Secondary | ICD-10-CM

## 2019-05-27 NOTE — Telephone Encounter (Addendum)
Last Visit: 02/18/19 Next Visit: 07/21/19 Labs: 02/18/19 stable PLQ Eye Exam: 10/22/17 WNL   Patient advised she is due to update her PLQ eye exam. Patient states she had it done in September and will have the eye doctor fax over results.   Okay to refill per Dr. Estanislado Pandy

## 2019-05-27 NOTE — Telephone Encounter (Signed)
Last Visit: 02/18/19 Next Visit: 07/21/19  Okay to refill per Dr. Estanislado Pandy

## 2019-06-13 ENCOUNTER — Telehealth: Payer: Self-pay | Admitting: Rheumatology

## 2019-06-13 NOTE — Telephone Encounter (Signed)
Patient left a voicemail stating she had her repeat labwork this week at [facility].  Patient states she was told that Dr. Estanislado Pandy would be able to view her results online.

## 2019-06-13 NOTE — Telephone Encounter (Signed)
Patient had blood work recheck at [facility] on 06/10/19. Can be viewed in [place]. WBC is 2.9. stable with previous WBC on 05/20/19. Patient on PLQ 1 tablet BID M-F and MTX 3 tabs weekly.

## 2019-06-13 NOTE — Telephone Encounter (Signed)
I reviewed patient's chart.  She had no synovitis at the last visit.  Please have her discontinue Plaquenil.  We will recheck CBC in 1 month.

## 2019-06-13 NOTE — Telephone Encounter (Signed)
Patient advised Dr Estanislado Pandy  reviewed her chart.  She had no synovitis at the last visit.  Patient advised to discontinue Plaquenil.  We will recheck CBC in 1 month. Patient verbalized to understanding.

## 2019-06-19 ENCOUNTER — Other Ambulatory Visit: Payer: Self-pay | Admitting: Rheumatology

## 2019-06-19 NOTE — Telephone Encounter (Signed)
Last Visit: 02/18/19 Next Visit: 07/21/19 UDS: 02/18/19 Narc Agreement: 02/18/19  Okay to refill Tramadol?

## 2019-06-29 IMAGING — DX DG CERVICAL SPINE WITH FLEX & EXTEND
7 series · 7 of 7 positions shown · non-contrast
Comparison: None.

CLINICAL DATA: Chronic neck pain.

EXAM:
CERVICAL SPINE COMPLETE WITH FLEXION AND EXTENSION VIEWS

[c-spine lat]
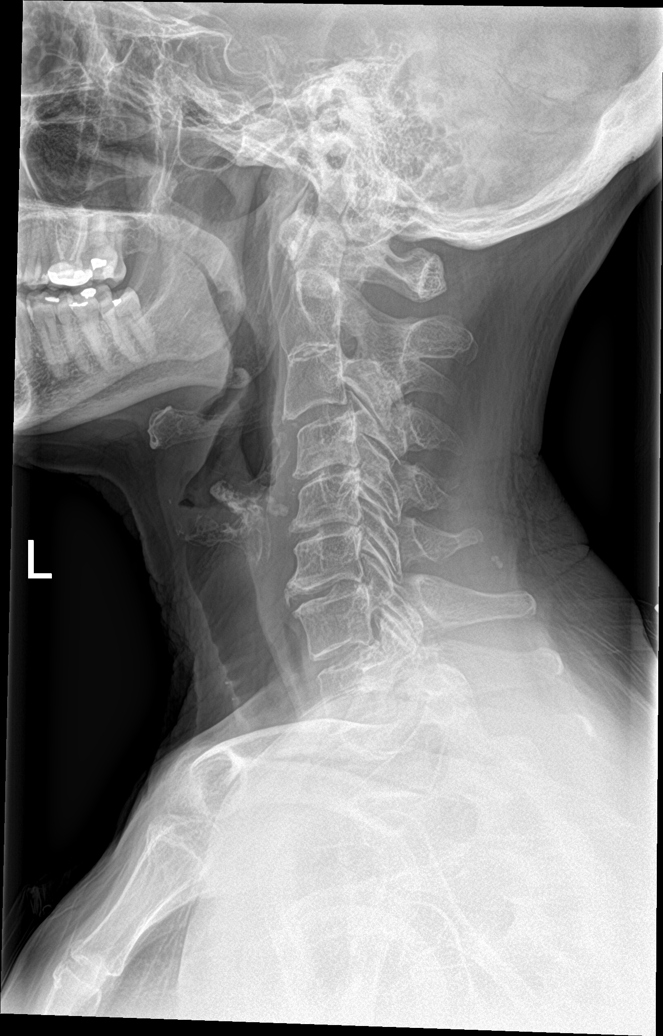

[c-spine flex]
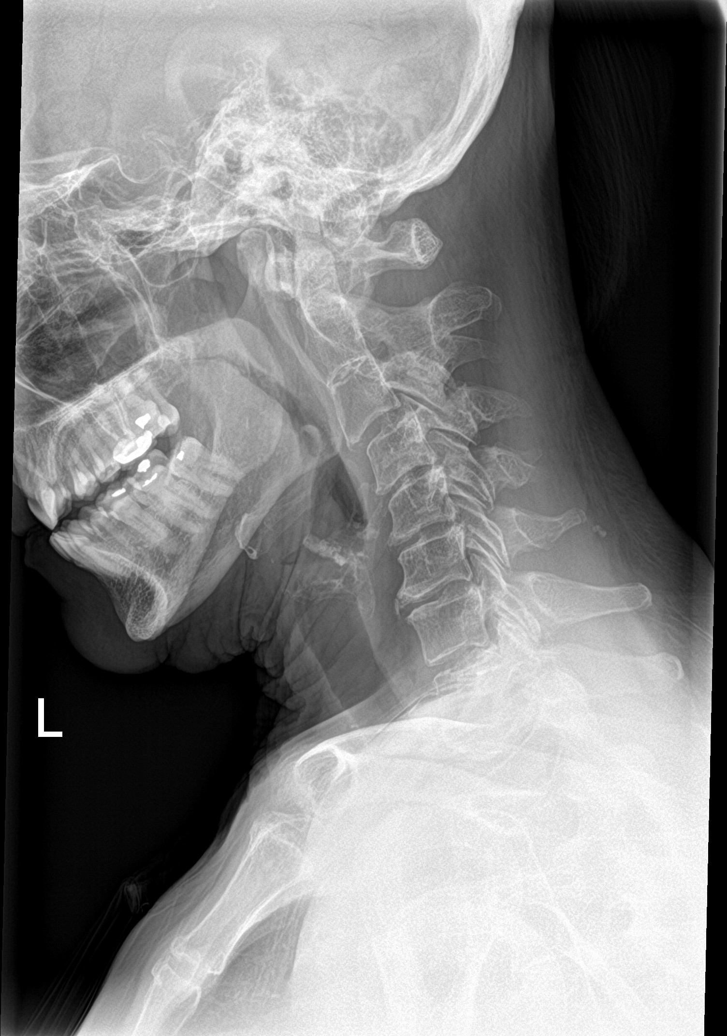

[c-spine ext]
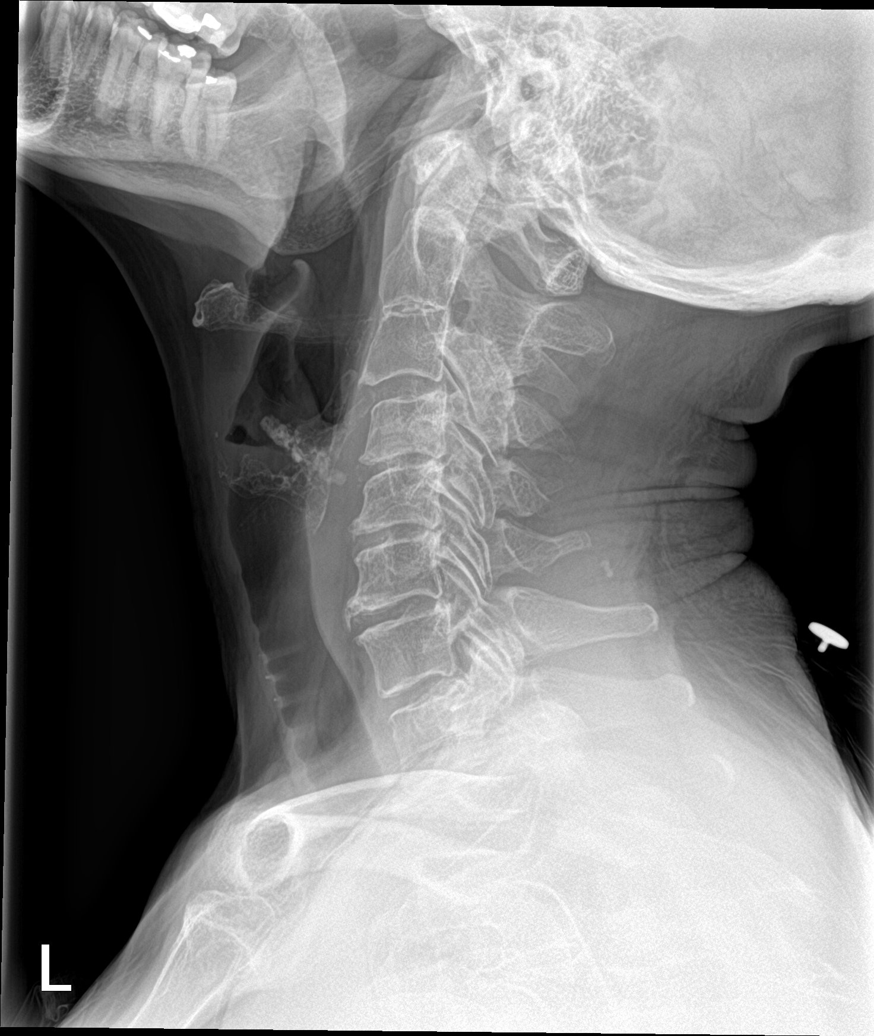

[c-spine obl (1 of 2)]
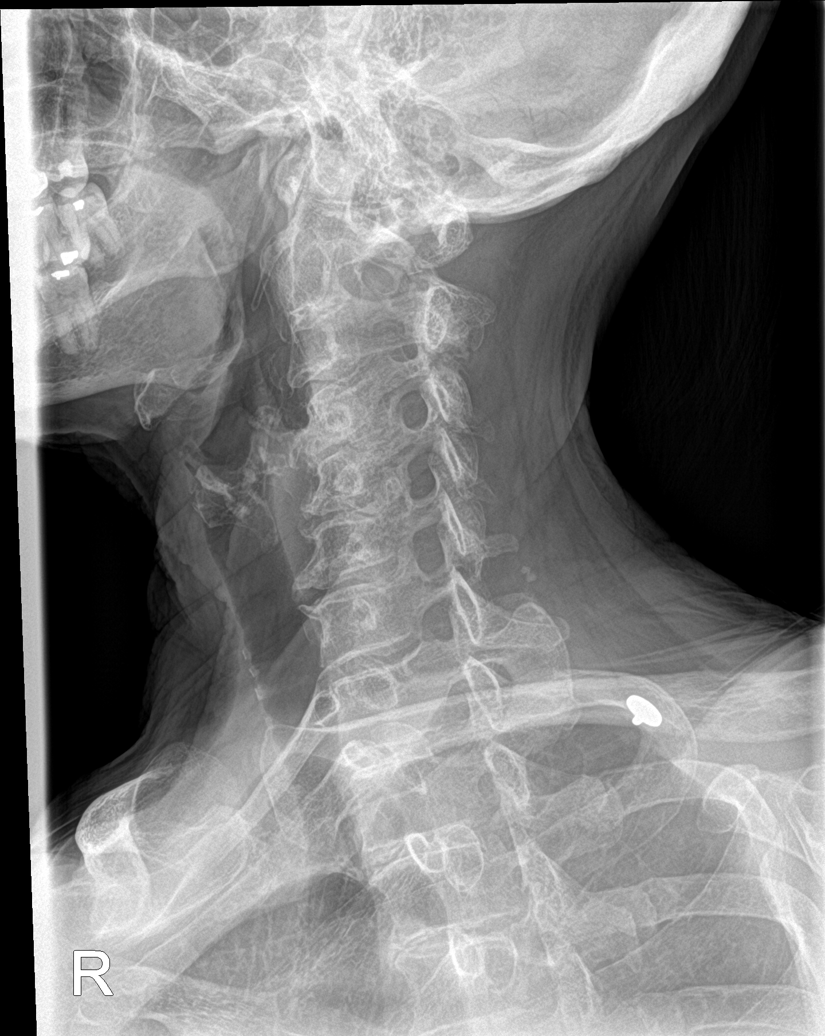

[c-spine ap]
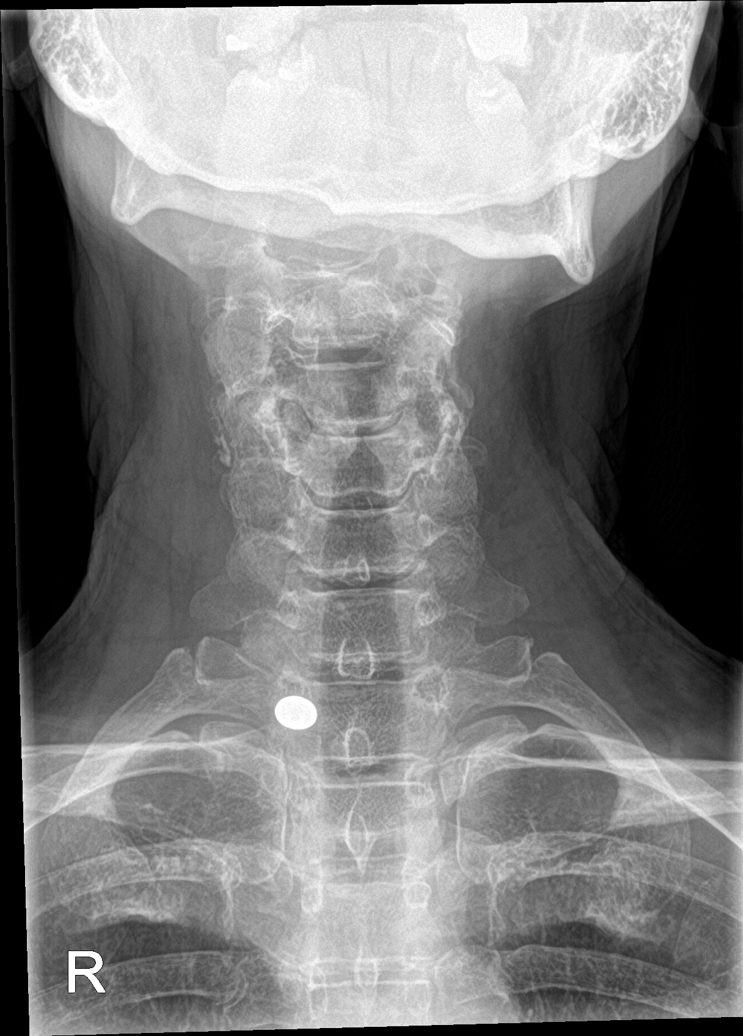

[c-spine open mouth]
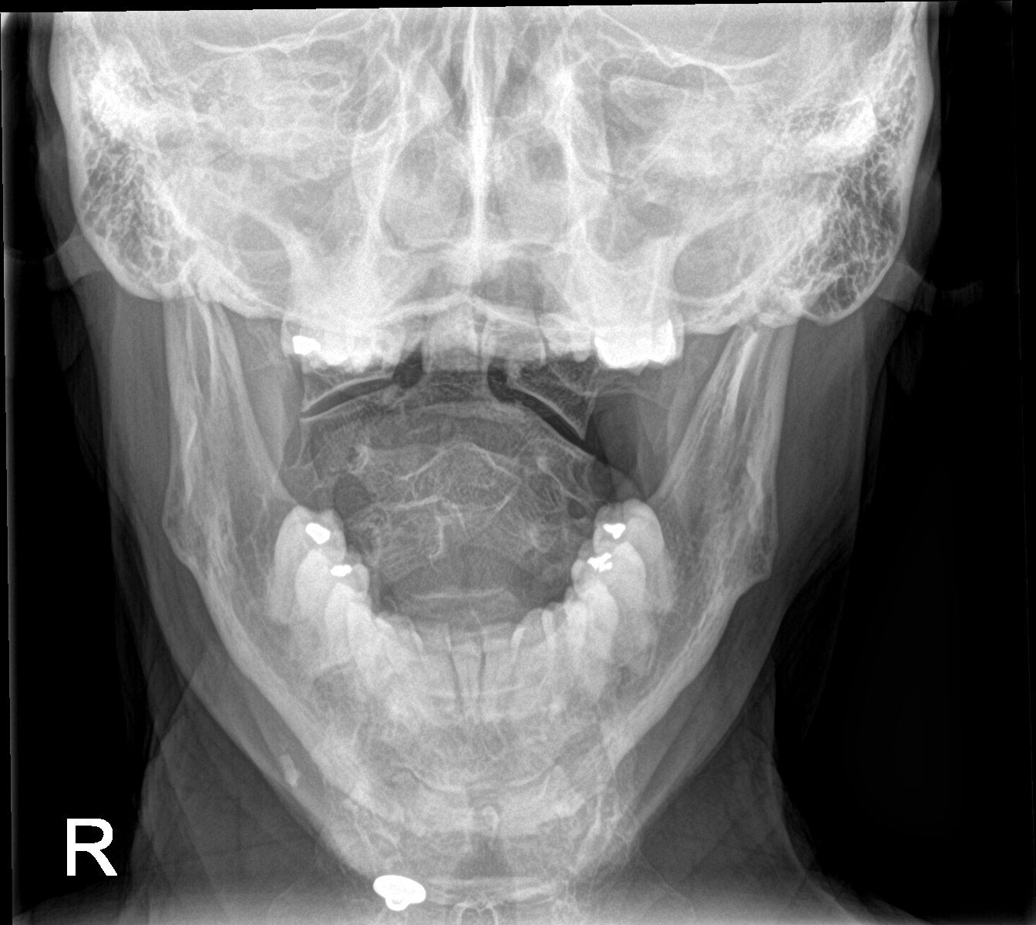

[c-spine obl (2 of 2)]
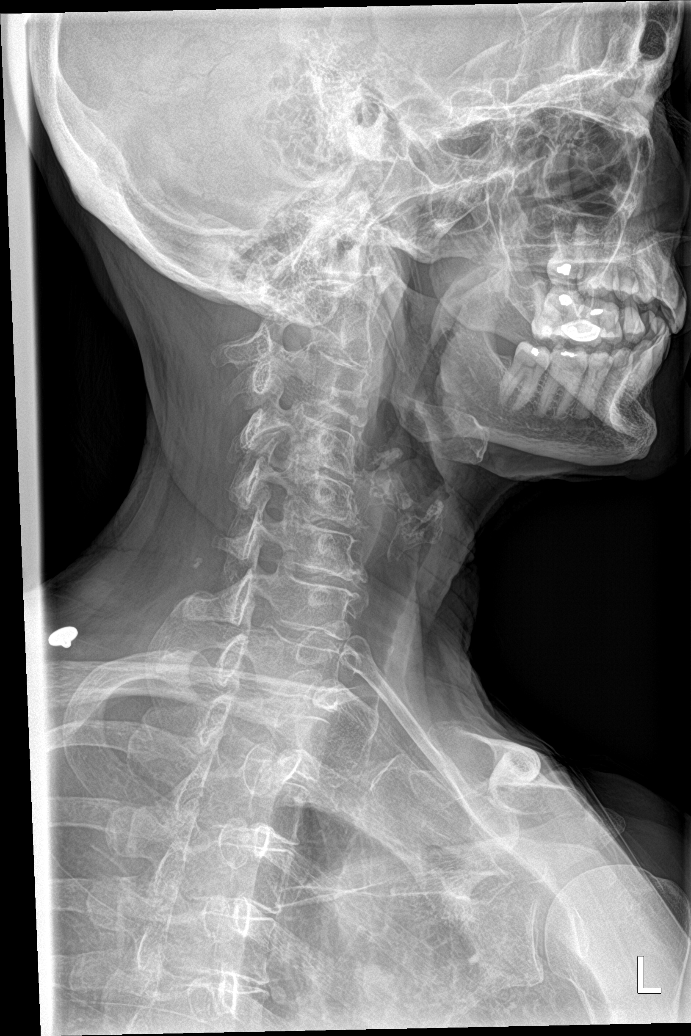

[7 of 7 positions shown; findings below may reference images not displayed]

FINDINGS: No fracture is identified. There is congenital fusion of the C2-3
level consistent with Rudi deformity. Mild to moderate loss
of disc space height is seen at C3-4, C4-5 and C5-6. Multilevel
facet arthropathy is noted. Trace anterolisthesis C3 on C4 is
unchanged with flexion and extension. No pathologic motion is
identified. Prevertebral soft tissues appear normal. Lung apices are
clear.
IMPRESSION: No acute abnormality.

Degenerative disc disease C4-C6. Multilevel facet arthropathy also
seen.

Congenital C2-3 fusion consistent with Rudi deformity.

## 2019-07-01 ENCOUNTER — Other Ambulatory Visit: Payer: Self-pay | Admitting: *Deleted

## 2019-07-01 ENCOUNTER — Other Ambulatory Visit: Payer: Self-pay

## 2019-07-01 DIAGNOSIS — Z79899 Other long term (current) drug therapy: Secondary | ICD-10-CM

## 2019-07-01 MED ORDER — METHOTREXATE 2.5 MG PO TABS: 7.5000 mg | ORAL_TABLET | ORAL | 0 refills | Status: DC

## 2019-07-01 NOTE — Telephone Encounter (Signed)
Refill request received via fax   Last Visit:02/18/19 Next Visit:07/21/19 Labs:05/20/19 Decrease methotrexate to 3 tablets p.o. weekly due to low WBC  Okay to refill per Dr. Estanislado Pandy

## 2019-07-02 LAB — COMPLETE METABOLIC PANEL WITH GFR
AG Ratio: 2 (calc) (ref 1.0–2.5)
ALT: 28 U/L (ref 6–29)
AST: 26 U/L (ref 10–35)
Albumin: 4.5 g/dL (ref 3.6–5.1)
Alkaline phosphatase (APISO): 43 U/L (ref 37–153)
BUN: 10 mg/dL (ref 7–25)
CO2: 27 mmol/L (ref 20–32)
Calcium: 9.6 mg/dL (ref 8.6–10.4)
Chloride: 98 mmol/L (ref 98–110)
Creat: 0.7 mg/dL (ref 0.50–0.99)
GFR, Est African American: 109 mL/min/{1.73_m2} (ref >=60)
GFR, Est Non African American: 94 mL/min/{1.73_m2} (ref >=60)
Globulin: 2.3 g/dL (calc) (ref 1.9–3.7)
Glucose, Bld: 81 mg/dL (ref 65–99)
Potassium: 4.4 mmol/L (ref 3.5–5.3)
Sodium: 135 mmol/L (ref 135–146)
Total Bilirubin: 0.3 mg/dL (ref 0.2–1.2)
Total Protein: 6.8 g/dL (ref 6.1–8.1)

## 2019-07-02 LAB — CBC WITH DIFFERENTIAL/PLATELET
Absolute Monocytes: 329 cells/uL (ref 200–950)
Basophils Absolute: 11 cells/uL (ref 0–200)
Basophils Relative: 0.3 %
Eosinophils Absolute: 60 cells/uL (ref 15–500)
Eosinophils Relative: 1.7 %
HCT: 31.9 % — ABNORMAL LOW (ref 35.0–45.0)
Hemoglobin: 10.7 g/dL — ABNORMAL LOW (ref 11.7–15.5)
Lymphs Abs: 1309 cells/uL (ref 850–3900)
MCH: 31.3 pg (ref 27.0–33.0)
MCHC: 33.5 g/dL (ref 32.0–36.0)
MCV: 93.3 fL (ref 80.0–100.0)
MPV: 11 fL (ref 7.5–12.5)
Monocytes Relative: 9.4 %
Neutro Abs: 1792 cells/uL (ref 1500–7800)
Neutrophils Relative %: 51.2 %
Platelets: 235 10*3/uL (ref 140–400)
RBC: 3.42 10*6/uL — ABNORMAL LOW (ref 3.80–5.10)
RDW: 13.6 % (ref 11.0–15.0)
Total Lymphocyte: 37.4 %
WBC: 3.5 10*3/uL — ABNORMAL LOW (ref 3.8–10.8)

## 2019-07-02 NOTE — Progress Notes (Signed)
Labs are stable.

## 2019-07-17 NOTE — Progress Notes (Signed)
Virtual Visit via Telephone Note  I connected with Mary Lane on 07/21/19 at  9:20 AM EST by telephone and verified that I am speaking with the correct person using two identifiers.  Location: Patient: Home  Provider: Clinic  This service was conducted via virtual visit. The patient was located at home. I was located in my office.  Consent was obtained prior to the virtual visit and is aware of possible charges through their insurance for this visit.  The patient is an established patient.  Dr. Estanislado Pandy, MD conducted the virtual visit and Hazel Sams, PA-C acted as scribe during the service.  Office staff helped with scheduling follow up visits after the service was conducted.   I discussed the limitations, risks, security and privacy concerns of performing an evaluation and management service by telephone and the availability of in person appointments. I also discussed with the patient that there may be a patient responsible charge related to this service. The patient expressed understanding and agreed to proceed.  CC: Pain in multiple joints  History of Present Illness: Patient is a 58 year old female with a past medical history of seropositive rheumatoid arthritis, osteoarthritis, DDD, and fibromyalgia. She takes MTX 3 tablets by mouth once weekly and folic acid 1 mg po daily. She discontinued PLQ after her recent lab work which revealed low WBC count.  She has been having increased pain in both wrist joints, both hands, and both feet since discontinuing PLQ.  She has not noticed any obvious swelling. She is also having increased neck pain and stiffness. She has had a few episodes of Raynaud's in the left hand with cooler temperatures. She has had a few recent fibromyalgia flares recently.  She takes Robaxin 500 mg BID PRN and flexeril 10 mg po at bedtime for muscle spasms.  She takes tramadol 50 mg 1 tablet in the morning and 2 tablets at bedtime. She has not been taking [place] recently.      Review of Systems  Constitutional: Positive for malaise/fatigue. Negative for fever.  HENT:       +Dry mouth  Eyes: Negative for photophobia, pain, discharge and redness.       +Dry eyes  Respiratory: Negative for cough, shortness of breath and wheezing.   Cardiovascular: Negative for chest pain and palpitations.  Gastrointestinal: Negative for blood in stool, constipation and diarrhea.  Genitourinary: Negative for dysuria.  Musculoskeletal: Positive for joint pain, myalgias and neck pain. Negative for back pain.       +Joint stiffness   Skin: Negative for rash.  Neurological: Negative for dizziness and headaches.  Psychiatric/Behavioral: Negative for depression. The patient is nervous/anxious and has insomnia.    She currently rates her RA a 5/10.    Observations/Objective: Physical Exam  Constitutional: She is oriented to person, place, and time.  Neurological: She is alert and oriented to person, place, and time.  Psychiatric: Mood, memory, affect and judgment normal.   Patient reports morning stiffness for 15 minutes.   Patient reports nocturnal pain.  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  She has no noticed any obvious joint swelling.  She is currently taking Methotrexate 3 tablets po  once weekly and folic acid 1 mg po daily. She discontinued PLQ due to low WBC count.  She has been experiencing increased arthralgias since discontinuing PLQ.  WBC count was 3.5 on 07/01/19, which has trended up since discontinuing.  We will continue to monitor.  She will recheck lab work in February.  She  will continue taking MTX as prescribed.  She does not need any refills at this time.  She was advised to notify us if she develops increased joint pain or joint swelling.  She will follow up in 3 months.   High risk medication use -  Methotrexate 3 tablets once weekly, folic acid 2 mg daily.  D/c PLQ due to low WBC count.  CBC and CMP were stable on 07/01/19. She is due to update lab work in February and every 3 months to monitor for drug toxicity.   Primary osteoarthritis of both hands -She has been having increased pain in both wrist joints and both hands.  No joint swelling.  Joint protection and muscle strengthening were discussed.   Primary osteoarthritis of both knees - She has intermittent pain in both knee joints. Lower extremity muscle strengthening were discussed.   Raynaud's syndrome without gangrene -She has been having intermittent symptoms of Raynaud's in the left hand with cooler weather temperatures.    Fibromyalgia - She has had several recent fibromyalgia flares. She continues to take Robaxin 500 mg BID PRN and flexeril 10 mg po at bedtime for muscle spasms.  She has been taking tramadol 50 mg 1 tablet in the morning and 2 tablets at bedtime.  She was encouraged to exercise on a regular basis.  She is no longer taking ambien and has been sleeping well at night overall.   DDD (degenerative disc disease), cervical -She has chronic neck pain and stiffness.  No symptoms of radiculopathy.   DDD (degenerative disc disease), lumbar - S/p fusion -Chronic pain   Primary insomnia -She is no longer taking Ambien at bedtime.  She takes flexeril 10 mg po at bedtime for muscle spasms.   History of depression-She is taking Lexapro 10 mg po daily.   History of vitamin D deficiency -She is on supplement.  Other medical conditions are listed as follows:   History of hypertension   History of gastroesophageal reflux (GERD)   History of hypercholesterolemia  Follow Up  Instructions: She will follow up in 3 months.    I discussed the assessment and treatment plan with the patient. The patient was provided an opportunity to ask questions and all were answered. The patient agreed with the plan and demonstrated an understanding of the instructions.   The patient was advised to call back or seek an in-person evaluation if the symptoms worsen or if the condition fails to improve as anticipated.  I provided 25 minutes of non-face-to-face time during this encounter.   Pollyann Savoy, MD   Scribed by-  Sherron Ales, PA-C

## 2019-07-21 ENCOUNTER — Other Ambulatory Visit: Payer: Self-pay

## 2019-07-21 ENCOUNTER — Encounter: Payer: Self-pay | Admitting: Rheumatology

## 2019-07-21 ENCOUNTER — Telehealth (INDEPENDENT_AMBULATORY_CARE_PROVIDER_SITE_OTHER): Payer: No Typology Code available for payment source | Admitting: Rheumatology

## 2019-07-21 DIAGNOSIS — M170 Bilateral primary osteoarthritis of knee: Secondary | ICD-10-CM | POA: Diagnosis not present

## 2019-07-21 DIAGNOSIS — M5136 Other intervertebral disc degeneration, lumbar region: Secondary | ICD-10-CM

## 2019-07-21 DIAGNOSIS — Z8659 Personal history of other mental and behavioral disorders: Secondary | ICD-10-CM

## 2019-07-21 DIAGNOSIS — Z79899 Other long term (current) drug therapy: Secondary | ICD-10-CM | POA: Diagnosis not present

## 2019-07-21 DIAGNOSIS — M19041 Primary osteoarthritis, right hand: Secondary | ICD-10-CM | POA: Diagnosis not present

## 2019-07-21 DIAGNOSIS — Z8639 Personal history of other endocrine, nutritional and metabolic disease: Secondary | ICD-10-CM

## 2019-07-21 DIAGNOSIS — M5030 Other cervical disc degeneration, unspecified cervical region: Secondary | ICD-10-CM

## 2019-07-21 DIAGNOSIS — I7300 Raynaud's syndrome without gangrene: Secondary | ICD-10-CM

## 2019-07-21 DIAGNOSIS — M0579 Rheumatoid arthritis with rheumatoid factor of multiple sites without organ or systems involvement: Secondary | ICD-10-CM

## 2019-07-21 DIAGNOSIS — F5101 Primary insomnia: Secondary | ICD-10-CM

## 2019-07-21 DIAGNOSIS — Z8719 Personal history of other diseases of the digestive system: Secondary | ICD-10-CM

## 2019-07-21 DIAGNOSIS — M19042 Primary osteoarthritis, left hand: Secondary | ICD-10-CM

## 2019-07-21 DIAGNOSIS — M797 Fibromyalgia: Secondary | ICD-10-CM

## 2019-07-21 DIAGNOSIS — Z8679 Personal history of other diseases of the circulatory system: Secondary | ICD-10-CM

## 2019-09-03 ENCOUNTER — Other Ambulatory Visit: Payer: Self-pay | Admitting: Rheumatology

## 2019-09-03 NOTE — Telephone Encounter (Signed)
Last Visit: 07/20/20 Next Visit: 10/21/19  Okay to refill per Dr. Corliss Skains

## 2019-09-11 ENCOUNTER — Other Ambulatory Visit: Payer: Self-pay | Admitting: Rheumatology

## 2019-09-11 NOTE — Telephone Encounter (Signed)
Last Visit: 07/20/20 Next Visit: 10/21/19 Labs: 07/01/19 stable  Okay to refill per Dr. Corliss Skains

## 2019-09-23 ENCOUNTER — Other Ambulatory Visit: Payer: Self-pay | Admitting: Pharmacist

## 2019-09-23 DIAGNOSIS — Z79899 Other long term (current) drug therapy: Secondary | ICD-10-CM

## 2019-09-23 LAB — COMPLETE METABOLIC PANEL WITH GFR
AG Ratio: 1.6 (calc) (ref 1.0–2.5)
ALT: 26 U/L (ref 6–29)
AST: 29 U/L (ref 10–35)
Albumin: 4.6 g/dL (ref 3.6–5.1)
Alkaline phosphatase (APISO): 54 U/L (ref 37–153)
BUN: 11 mg/dL (ref 7–25)
CO2: 30 mmol/L (ref 20–32)
Calcium: 9.9 mg/dL (ref 8.6–10.4)
Chloride: 97 mmol/L — ABNORMAL LOW (ref 98–110)
Creat: 0.63 mg/dL (ref 0.50–0.99)
GFR, Est African American: 113 mL/min/{1.73_m2} (ref >=60)
GFR, Est Non African American: 97 mL/min/{1.73_m2} (ref >=60)
Globulin: 2.8 g/dL (calc) (ref 1.9–3.7)
Glucose, Bld: 82 mg/dL (ref 65–99)
Potassium: 4.4 mmol/L (ref 3.5–5.3)
Sodium: 134 mmol/L — ABNORMAL LOW (ref 135–146)
Total Bilirubin: 0.3 mg/dL (ref 0.2–1.2)
Total Protein: 7.4 g/dL (ref 6.1–8.1)

## 2019-09-23 LAB — CBC WITH DIFFERENTIAL/PLATELET
Absolute Monocytes: 335 cells/uL (ref 200–950)
Basophils Absolute: 9 cells/uL (ref 0–200)
Basophils Relative: 0.3 %
Eosinophils Absolute: 62 cells/uL (ref 15–500)
Eosinophils Relative: 2 %
HCT: 35.3 % (ref 35.0–45.0)
Hemoglobin: 12 g/dL (ref 11.7–15.5)
Lymphs Abs: 1073 cells/uL (ref 850–3900)
MCH: 31.4 pg (ref 27.0–33.0)
MCHC: 34 g/dL (ref 32.0–36.0)
MCV: 92.4 fL (ref 80.0–100.0)
MPV: 11.1 fL (ref 7.5–12.5)
Monocytes Relative: 10.8 %
Neutro Abs: 1621 cells/uL (ref 1500–7800)
Neutrophils Relative %: 52.3 %
Platelets: 245 10*3/uL (ref 140–400)
RBC: 3.82 10*6/uL (ref 3.80–5.10)
RDW: 12.9 % (ref 11.0–15.0)
Total Lymphocyte: 34.6 %
WBC: 3.1 10*3/uL — ABNORMAL LOW (ref 3.8–10.8)

## 2019-09-24 NOTE — Progress Notes (Signed)
CMP is stable.  Riss cell count is mildly decreased.We will monitor labs every 3 months.

## 2019-10-11 ENCOUNTER — Other Ambulatory Visit: Payer: Self-pay | Admitting: Rheumatology

## 2019-10-14 NOTE — Progress Notes (Signed)
Office Visit Note  Patient: Mary Lane             Date of Birth: 03-31-1961           MRN: [id number]             PCP: Lynn Ito, MD Referring: Lynn Ito, MD Visit Date: 10/21/2019 Occupation: @GUAROCC @  Subjective:  Pain in multiple joints  History of Present Illness: Mary Lane is a 59 y.o. female with a past medical history of seropositive rheumatoid arthritis, osteoarthritis, degenerative disc disease, and fibromyalgia.  She was previously discontinued on Plaquenil due to low WBC count. Since stopping this medication, she has noticed an increase in pain of her bilateral shoulders, bilateral knees, and bilateral hands. She also has been experiencing bilateral swelling in her hands and feet. She states that her pain is aggravated with activities and she especially has difficulty when performing small movements. She endorses morning stiffness for 2-3 hours. She also has persistent cervical and lumbar back pain. She continues to take Methotrexate 3 tablets by mouth once weekly and folic acid 1 mg by mouth once weekly. She also states that she has recently been having difficulty with her jaws locking. She has seen an orthodontist who recommended surgery, but she states that she has been unable to find someone due to insurance coverage. She endorses flares of rheumatoid arthritis and currently rates her rheumatoid arthritis an 8 out of 10. She has noticed an improvement in her raynaud's with the warmer weather changes. She denies any recent fibromyalgia flares, but states that her joint pain has aggravated her fibromyalgia. She continues to have difficulty sleeping at night and is only able to sleep for 4-5 hours at a time. She continues to take Robaxin 500 mg 1 tablet PRN and Flexeril 10 mg at bedtime. She also takes melatonin nightly. She is currently taking Tramadol 50 two tablets at night and one tablet in the morning. She states that she has occasionally needed to take two tramadol in  the morning due to her increased pain recently.   Activities of Daily Living:  Patient reports morning stiffness for 2-3 hours.   Patient Reports nocturnal pain.  Difficulty dressing/grooming: Denies Difficulty climbing stairs: Reports Difficulty getting out of chair: Reports Difficulty using hands for taps, buttons, cutlery, and/or writing: Reports  Review of Systems  Constitutional: Negative for fatigue.  HENT: Positive for mouth dryness and nose dryness. Negative for mouth sores.   Eyes: Positive for dryness.  Respiratory: Negative for shortness of breath, wheezing and difficulty breathing.   Cardiovascular: Negative for chest pain and palpitations.  Gastrointestinal: Negative for blood in stool, constipation and diarrhea.  Endocrine: Negative for increased urination.  Genitourinary: Negative for difficulty urinating and painful urination.  Musculoskeletal: Positive for arthralgias, joint pain, joint swelling and morning stiffness.  Skin: Positive for color change. Negative for rash and hair loss.  Allergic/Immunologic: Negative for susceptible to infections.  Neurological: Positive for numbness and weakness. Negative for dizziness, headaches and memory loss.  Hematological: Negative for bruising/bleeding tendency.  Psychiatric/Behavioral: Positive for sleep disturbance. Negative for confusion.    PMFS History:  Patient Active Problem List   Diagnosis Date Noted  . Rheumatoid arthritis with rheumatoid factor of multiple sites without organ or systems involvement (HCC) 09/25/2017  . Raynaud's syndrome without gangrene 09/25/2017  . Medication monitoring encounter 09/25/2017  . Primary osteoarthritis of both hands 09/25/2017  . Primary osteoarthritis of both knees 09/25/2017  . DDD (degenerative disc disease), lumbar 09/25/2017  .  Primary insomnia 09/25/2017  . Fibromyalgia 09/25/2017  . Mouth sores 09/25/2017  . Essential hypertension 09/25/2017  . History of  gastroesophageal reflux (GERD) 09/25/2017  . History of depression 09/25/2017  . Vitamin D deficiency 09/25/2017  . History of hypercholesterolemia 09/25/2017  . High risk medication use 09/06/2016    History reviewed. No pertinent past medical history.  Family History  Problem Relation Age of Onset  . Hypertension Mother   . Prostate cancer Father   . Hypertension Father   . Hypertension Sister   . Hypercholesterolemia Sister   . Bipolar disorder Sister   . Depression Sister   . Healthy Daughter   . Healthy Son    Past Surgical History:  Procedure Laterality Date  . ABDOMINAL HYSTERECTOMY    . APPENDECTOMY    . CARPAL TUNNEL RELEASE Bilateral   . CHOLECYSTECTOMY    . ERCP    . KNEE SURGERY Bilateral   . TUBAL LIGATION     Social History   Social History Narrative  . Not on file   Immunization History  Administered Date(s) Administered  . Zoster 09/02/2013     Objective: Vital Signs: BP 120/74 (BP Location: Left Arm, Patient Position: Sitting, Cuff Size: Normal)   Pulse 82   Resp 13   Ht 5\' 4"  (1.626 m)   Wt 134 lb 9.6 oz (61.1 kg)   BMI 23.10 kg/m    Physical Exam Vitals and nursing note reviewed.  Constitutional:      General: She is not in acute distress.    Appearance: Normal appearance.  HENT:     Head: Normocephalic and atraumatic.  Eyes:     Conjunctiva/sclera: Conjunctivae normal.  Cardiovascular:     Rate and Rhythm: Normal rate.     Pulses: Normal pulses.  Musculoskeletal:        General: No swelling.     Cervical back: Normal range of motion and neck supple. No tenderness.     Right lower leg: No edema.     Left lower leg: No edema.  Skin:    General: Skin is warm and dry.  Neurological:     Mental Status: She is alert and oriented to person, place, and time.  Psychiatric:        Behavior: Behavior normal.     Musculoskeletal Exam: Cervical spine, thoracic spine, and lumbar spine with good ROM. Cervical discomfort present with  lateral rotation, flexion, and extension.  No midline spinal tenderness or SI joint tenderness. Shoulder joints, elbow joints, wrist joints, MCPs, PIPs, and DIPs with good ROM and no synovitis. Right shoulder had slightly limited ROM. Bilateral MCP thickening noted with tenderness. Complete fist formation bilaterally. Hip joints, knee joints, ankle joints, MTPs, and PIPs with good ROM. Tenderness noted of palpation of bilateral MTPs. No warmth or effusion of bilateral knees. No tenderness or edema of bilateral ankle joints.    CDAI Exam: CDAI Score: 12.6  Patient Global: 8 mm; Provider Global: 8 mm Swollen: 0 ; Tender: 16  Joint Exam 10/21/2019      Right  Left  Glenohumeral   Tender   Tender  MCP 2   Tender   Tender  MCP 3   Tender   Tender  PIP 2   Tender     PIP 3   Tender   Tender  PIP 4   Tender   Tender  Tarsometatarsal   Tender     MTP 2      Tender  MTP  3      Tender  MTP 4   Tender     MTP 5   Tender        Investigation: No additional findings.  Imaging: No results found.  Recent Labs: Lab Results  Component Value Date   WBC 3.1 (L) 09/23/2019   HGB 12.0 09/23/2019   PLT 245 09/23/2019   NA 134 (L) 09/23/2019   K 4.4 09/23/2019   CL 97 (L) 09/23/2019   CO2 30 09/23/2019   GLUCOSE 82 09/23/2019   BUN 11 09/23/2019   CREATININE 0.63 09/23/2019   BILITOT 0.3 09/23/2019   ALKPHOS 52 08/28/2016   AST 29 09/23/2019   ALT 26 09/23/2019   PROT 7.4 09/23/2019   ALBUMIN 4.6 08/28/2016   CALCIUM 9.9 09/23/2019   GFRAA 113 09/23/2019    Speciality Comments: PLQ Eye Exam: 10/22/17 WNL   Procedures:  No procedures performed Allergies: Baclofen, Gabapentin, Ivp dye [iodinated diagnostic agents], and Sulfa antibiotics   Assessment / Plan:     Visit Diagnoses: Rheumatoid arthritis with rheumatoid factor of multiple sites without organ or systems involvement ([place]) -  +RF, +ANA - Since stopping Plaquenil, and reducing the dose of methotrexate she has noticed an  increase in joint pain of bilateral shoulders, bilateral hands, and bilateral knees. She has also noticed increased joint stiffness for 2-3 hours in the morning and occasional swelling in her bilateral hands and feet. Tenderness noted on examination, but no synovitis. Bilateral MCP thickening. She is currently on Methotrexate 3 tablets once weekly and folic acid 2 mg daily with little relief.  Different treatment options and their side effects were discussed.  Patient symptoms are not controlled with the current regimen despite taking Aleve and Tylenol.  We discussed starting her on Enbrel.  Indication side effects contraindications were discussed.  Patient was in agreement to proceed with Enbrel.  My plan will be to take her off methotrexate while she starts Enbrel because of neutropenia.  We will need labs today.  Medication counseling:    Does patient have diagnosis of heart failure?  No  Counseled patient that Enbrel is a TNF blocking agent.  Reviewed Enbrel dose of 50 mg once weekly.  Counseled patient on purpose, proper use, and adverse effects of Enbrel.  Reviewed the most common adverse effects including infections, headache, and injection site reactions. Discussed that there is the possibility of an increased risk of malignancy but it is not well understood if this increased risk is due to the medication or the disease state.  Advised patient to get yearly dermatology exams due to risk of skin cancer.  Reviewed the importance of regular labs while on Enbrel therapy.  Advised patient to get standing labs one month after starting Enbrel then every 2 months.  Provided patient with standing lab orders.  Counseled patient that Enbrel should be held prior to scheduled surgery.  Counseled patient to avoid live vaccines while on Enbrel.  Advised patient to get annual influenza vaccine and the pneumococcal vaccine as needed.  Provided patient with medication education material and answered all questions.   Patient voiced understanding.  Patient consented to Enbrel.  Will upload consent into the media tab.  Reviewed storage instructions for Enbrel.  Advised initial injection must be administered in office.  Patient voiced understanding.    High risk medication use - Methotrexate 3 tablets once weekly, folic acid 2 mg daily.  Discontonued PLQ due to low WBC count.  Her Saindon cell count is  a still at 3.1.  Primary osteoarthritis of both hands - She endorses worsening pain in her bilateral hands with occasional swelling. Tenderness noted during examination. No synovitis or swelling. Bilateral MCP thickening.    Primary osteoarthritis of both knees - She endorses persistent bilateral knee pain. Good ROM present with no tenderness to palpation.   Raynaud's syndrome without gangrene - She has noticed an improvement in her Raynaud's symptoms with the warmer weather changes.   Fibromyalgia - Robaxin 500 mg BID PRN and flexeril 10 mg po at bedtime for muscle spasms.  She has been taking tramadol 50 mg 1 tablet in the morning and 2 tablets at bedtime. Recently, she will occasionally have to take two tablets of Tramadol in the morning due to increased pain. She continues to have difficulty sleeping at night.   Medication monitoring encounter - tramadol. UDS & narc agreement: 02/18/2019  DDD (degenerative disc disease), cervical-minimal discomfort.  DDD (degenerative disc disease), lumbar - S/p fusion.  She has limited range of motion.  Primary insomnia - She is no longer taking Ambien at bedtime.  She takes flexeril 10 mg po at bedtime for muscle spasms.   History of vitamin D deficiency  History of depression - Lexapro 10 mg po daily.   History of hypercholesterolemia  History of gastroesophageal reflux (GERD)  History of hypertension  Orders: Orders Placed This Encounter  Procedures  . Pain Mgmt, Profile 5 w/Conf, U  . Pain Mgmt, Tramadol w/medMATCH, U  . Hepatitis panel, acute  . HIV Antibody  (routine testing w rflx)  . QuantiFERON-TB Gold Plus  . Serum protein electrophoresis with reflex  . IgG, IgA, IgM   No orders of the defined types were placed in this encounter.   Face-to-face time spent with patient was 35 minutes. Greater than 50% of time was spent in counseling and coordination of care.  Follow-Up Instructions: Return in about 6 weeks (around 12/02/2019) for Rheumatoid arthritis, Osteoarthritis.   Pollyann Savoy, MD  Note - This record has been created using Animal nutritionist.  Chart creation errors have been sought, but may not always  have been located. Such creation errors do not reflect on  the standard of medical care.

## 2019-10-21 ENCOUNTER — Encounter: Payer: Self-pay | Admitting: Rheumatology

## 2019-10-21 ENCOUNTER — Ambulatory Visit (INDEPENDENT_AMBULATORY_CARE_PROVIDER_SITE_OTHER): Payer: Medicare Other | Admitting: Rheumatology

## 2019-10-21 ENCOUNTER — Other Ambulatory Visit: Payer: Self-pay

## 2019-10-21 ENCOUNTER — Telehealth: Payer: Self-pay | Admitting: Pharmacist

## 2019-10-21 VITALS — BP 120/74 | HR 82 | Resp 13 | Ht 64.0 in | Wt 134.6 lb

## 2019-10-21 DIAGNOSIS — M5030 Other cervical disc degeneration, unspecified cervical region: Secondary | ICD-10-CM

## 2019-10-21 DIAGNOSIS — M19041 Primary osteoarthritis, right hand: Secondary | ICD-10-CM

## 2019-10-21 DIAGNOSIS — Z8679 Personal history of other diseases of the circulatory system: Secondary | ICD-10-CM

## 2019-10-21 DIAGNOSIS — Z8639 Personal history of other endocrine, nutritional and metabolic disease: Secondary | ICD-10-CM

## 2019-10-21 DIAGNOSIS — M5136 Other intervertebral disc degeneration, lumbar region: Secondary | ICD-10-CM

## 2019-10-21 DIAGNOSIS — F5101 Primary insomnia: Secondary | ICD-10-CM

## 2019-10-21 DIAGNOSIS — Z8719 Personal history of other diseases of the digestive system: Secondary | ICD-10-CM

## 2019-10-21 DIAGNOSIS — M19042 Primary osteoarthritis, left hand: Secondary | ICD-10-CM

## 2019-10-21 DIAGNOSIS — Z79899 Other long term (current) drug therapy: Secondary | ICD-10-CM | POA: Diagnosis not present

## 2019-10-21 DIAGNOSIS — M170 Bilateral primary osteoarthritis of knee: Secondary | ICD-10-CM

## 2019-10-21 DIAGNOSIS — M0579 Rheumatoid arthritis with rheumatoid factor of multiple sites without organ or systems involvement: Secondary | ICD-10-CM

## 2019-10-21 DIAGNOSIS — M797 Fibromyalgia: Secondary | ICD-10-CM

## 2019-10-21 DIAGNOSIS — I7300 Raynaud's syndrome without gangrene: Secondary | ICD-10-CM

## 2019-10-21 DIAGNOSIS — Z8659 Personal history of other mental and behavioral disorders: Secondary | ICD-10-CM

## 2019-10-21 DIAGNOSIS — Z5181 Encounter for therapeutic drug level monitoring: Secondary | ICD-10-CM

## 2019-10-21 NOTE — Progress Notes (Signed)
Pharmacy Note  Subjective: Patient presents today to the [facility] for follow up office visit.  Patient seen by the pharmacist for counseling on Enbrel for rheumatoid arthritis.  She has tried and failed Plaquenil and methotrexate.  Both caused neutropenia.  Objective:  CBC    Component Value Date/Time   WBC 3.1 (L) 09/23/2019 1317   RBC 3.82 09/23/2019 1317   HGB 12.0 09/23/2019 1317   HGB 11.7 08/28/2016 1350   HCT 35.3 09/23/2019 1317   HCT 34.8 08/28/2016 1350   PLT 245 09/23/2019 1317   PLT 284 08/28/2016 1350   MCV 92.4 09/23/2019 1317   MCV 90 08/28/2016 1350   MCH 31.4 09/23/2019 1317   MCHC 34.0 09/23/2019 1317   RDW 12.9 09/23/2019 1317   RDW 13.8 08/28/2016 1350   LYMPHSABS 1,073 09/23/2019 1317   LYMPHSABS 2.6 08/28/2016 1350   MONOABS 424 02/09/2017 1112   EOSABS 62 09/23/2019 1317   EOSABS 0.1 08/28/2016 1350   BASOSABS 9 09/23/2019 1317   BASOSABS 0.0 08/28/2016 1350     CMP     Component Value Date/Time   NA 134 (L) 09/23/2019 1317   NA 128 (L) 08/28/2016 1350   K 4.4 09/23/2019 1317   CL 97 (L) 09/23/2019 1317   CO2 30 09/23/2019 1317   GLUCOSE 82 09/23/2019 1317   BUN 11 09/23/2019 1317   BUN 8 08/28/2016 1350   CREATININE 0.63 09/23/2019 1317   CALCIUM 9.9 09/23/2019 1317   PROT 7.4 09/23/2019 1317   PROT 7.4 08/28/2016 1350   ALBUMIN 4.6 08/28/2016 1350   AST 29 09/23/2019 1317   ALT 26 09/23/2019 1317   ALKPHOS 52 08/28/2016 1350   BILITOT 0.3 09/23/2019 1317   BILITOT 0.4 08/28/2016 1350   GFRNONAA 97 09/23/2019 1317   GFRAA 113 09/23/2019 1317     Baseline Immunosuppressant Therapy Labs TB GOLD Pending 10/21/2019  Hepatitis Panel Pending 10/21/2019  HIV Pending 10/21/2019  Immunoglobulins Pending 10/21/2019  SPEP Pending 10/21/2019  G6PD No results found for: G6PDH TPMT No results found for: TPMT   Chest x-ray: pending 10/21/2019  Does patient have diagnosis of heart failure?  No  Assessment/Plan:  Counseled  patient that Enbrel is a TNF blocking agent. Counseled patient on purpose, proper use, and adverse effects of Enbrel.  Reviewed the most common adverse effects including infections, headache, and injection site reactions. Discussed that there is the possibility of an increased risk of malignancy but it is not well understood if this increased risk is due to the medication or the disease state.  Advised patient to get yearly dermatology exams due to risk of skin cancer.  Counseled patient that Enbrel should be held prior to scheduled surgery.  Counseled patient to avoid live vaccines while on Enbrel. Recommend annual influenza, Pneumovax 23, Prevnar 13, and Shingrix as indicated.   Reviewed the importance of regular labs while on Enbrel therapy.  Advised patient to get standing labs one month after starting Enbrel then every 2 months.  Provided patient with standing lab orders.  Provided patient with medication education material and answered all questions.  Patient voiced understanding.  Patient consented to Enbrel.  Will upload consent into the media tab.  Reviewed storage instructions for Enbrel.  Advised initial injection must be administered in office.  Patient voiced understanding.    Patient dose will be Enbrel 50 mg every 7 days.  Prescription pending labs and/or insurance.  All questions encouraged and answered.  Instructed patient to  call with any further questions or concerns.   Mariella Saa, PharmD, [place], [place] Clinical Specialty Pharmacist [phone]  10/21/2019 11:30 AM

## 2019-10-21 NOTE — Telephone Encounter (Signed)
Please start benefits investigation for Enbrel.  Patient has tried Plaquenil and MTX but both caused neutropenia.   Verlin Fester, PharmD, [place], CPP Clinical Specialty Pharmacist [phone]  10/21/2019 11:53 AM

## 2019-10-22 NOTE — Telephone Encounter (Signed)
Received notification from [employer] regarding a prior authorization for ENBREL. Authorization has been APPROVED from 09/22/19 to 08/13/98.   Authorization # [id number] Phone # [phone]  Will attempt test claim, later today.  8:34 AM Dorthula Nettles, CPhT

## 2019-10-22 NOTE — Telephone Encounter (Signed)
Attempted test claim, per plan patient must fill through [facility].

## 2019-10-23 ENCOUNTER — Telehealth: Payer: Self-pay | Admitting: Rheumatology

## 2019-10-23 LAB — IGG, IGA, IGM
IgG (Immunoglobin G), Serum: 1169 mg/dL (ref 600–1640)
IgM, Serum: 71 mg/dL (ref 50–300)
Immunoglobulin A: 223 mg/dL (ref 47–310)

## 2019-10-23 LAB — QUANTIFERON-TB GOLD PLUS
Mitogen-NIL: >10 IU/mL
NIL: 0.04 IU/mL
QuantiFERON-TB Gold Plus: NEGATIVE
TB1-NIL: 0.01 IU/mL
TB2-NIL: 0 IU/mL

## 2019-10-23 LAB — PAIN MGMT, PROFILE 5 W/CONF, U
Amphetamines: NEGATIVE ng/mL
Barbiturates: NEGATIVE ng/mL
Benzodiazepines: NEGATIVE ng/mL
Cocaine Metabolite: NEGATIVE ng/mL
Creatinine: 103 mg/dL
Marijuana Metabolite: NEGATIVE ng/mL
Methadone Metabolite: NEGATIVE ng/mL
Opiates: NEGATIVE ng/mL
Oxidant: NEGATIVE ug/mL
Oxycodone: NEGATIVE ng/mL
pH: 6.7 (ref 4.5–9.0)

## 2019-10-23 LAB — HEPATITIS PANEL, ACUTE
Hep A IgM: NONREACTIVE
Hep B C IgM: NONREACTIVE
Hepatitis B Surface Ag: NONREACTIVE
Hepatitis C Ab: NONREACTIVE
SIGNAL TO CUT-OFF: 0.01 (ref <1.00)

## 2019-10-23 LAB — PAIN MGMT, TRAMADOL W/MEDMATCH, U
Desmethyltramadol: 4291 ng/mL
Tramadol: >10000 ng/mL

## 2019-10-23 LAB — PROTEIN ELECTROPHORESIS, SERUM, WITH REFLEX
Albumin ELP: 4.8 g/dL (ref 3.8–4.8)
Alpha 1: 0.3 g/dL (ref 0.2–0.3)
Alpha 2: 0.8 g/dL (ref 0.5–0.9)
Beta 2: 0.4 g/dL (ref 0.2–0.5)
Beta Globulin: 0.5 g/dL (ref 0.4–0.6)
Gamma Globulin: 1.1 g/dL (ref 0.8–1.7)
Total Protein: 7.8 g/dL (ref 6.1–8.1)

## 2019-10-23 LAB — HIV ANTIBODY (ROUTINE TESTING W REFLEX): HIV 1&2 Ab, 4th Generation: NONREACTIVE

## 2019-10-23 MED ORDER — TRAMADOL HCL 50 MG PO TABS: ORAL_TABLET | ORAL | 0 refills | Status: DC

## 2019-10-23 NOTE — Telephone Encounter (Signed)
Last Visit: 10/21/19 Next Visit: 12/02/19 UDS:10/21/19 Narc Agreement: 03/11/19  Okay to refill Tramadol?

## 2019-10-23 NOTE — Telephone Encounter (Signed)
Patient called requesting prescription refill of Tramadol to be sent to [facility].  Patient states she had labwork on 10/21/19.

## 2019-10-23 NOTE — Progress Notes (Signed)
All the labs are back now.  The plan was to start on Enbrel after the lab results.  Please review the last office note.

## 2019-10-27 NOTE — Progress Notes (Signed)
Patient returned call.  Notified patient of lab results and scheduled new start visit for 3/18 @ 10am.

## 2019-10-30 ENCOUNTER — Telehealth: Payer: Self-pay | Admitting: Pharmacist

## 2019-10-30 ENCOUNTER — Other Ambulatory Visit: Payer: Self-pay

## 2019-10-30 ENCOUNTER — Ambulatory Visit (INDEPENDENT_AMBULATORY_CARE_PROVIDER_SITE_OTHER): Payer: Medicare Other | Admitting: Pharmacist

## 2019-10-30 VITALS — BP 130/77 | HR 72

## 2019-10-30 DIAGNOSIS — M0579 Rheumatoid arthritis with rheumatoid factor of multiple sites without organ or systems involvement: Secondary | ICD-10-CM

## 2019-10-30 DIAGNOSIS — Z7189 Other specified counseling: Secondary | ICD-10-CM | POA: Diagnosis not present

## 2019-10-30 MED ORDER — ENBREL MINI 50 MG/ML ~~LOC~~ SOCT: 50.0000 mg | SUBCUTANEOUS | 0 refills | Status: DC

## 2019-10-30 NOTE — Telephone Encounter (Signed)
Called patient to notify that she left her Enbrel Autotouch at the office after her new start appointment. She will come and pick up sometime early next week.  Autouch is at the frontdesk for patient to pick up.  Verlin Fester, PharmD, [place], CPP Clinical Specialty Pharmacist [phone]  10/30/2019 11:49 AM

## 2019-10-30 NOTE — Progress Notes (Signed)
Pharmacy Note  Subjective:   Patient is being initiated on Enbrel.  Patient was previously counseled extensively on and consented to initiation of Enbrel at that time.  Patient presents to clinic today to receive the first dose of Enbrel.     Objective: CMP     Component Value Date/Time   NA 134 (L) 09/23/2019 1317   NA 128 (L) 08/28/2016 1350   K 4.4 09/23/2019 1317   CL 97 (L) 09/23/2019 1317   CO2 30 09/23/2019 1317   GLUCOSE 82 09/23/2019 1317   BUN 11 09/23/2019 1317   BUN 8 08/28/2016 1350   CREATININE 0.63 09/23/2019 1317   CALCIUM 9.9 09/23/2019 1317   PROT 7.8 10/21/2019 1137   PROT 7.4 08/28/2016 1350   ALBUMIN 4.6 08/28/2016 1350   AST 29 09/23/2019 1317   ALT 26 09/23/2019 1317   ALKPHOS 52 08/28/2016 1350   BILITOT 0.3 09/23/2019 1317   BILITOT 0.4 08/28/2016 1350   GFRNONAA 97 09/23/2019 1317   GFRAA 113 09/23/2019 1317    CBC    Component Value Date/Time   WBC 3.1 (L) 09/23/2019 1317   RBC 3.82 09/23/2019 1317   HGB 12.0 09/23/2019 1317   HGB 11.7 08/28/2016 1350   HCT 35.3 09/23/2019 1317   HCT 34.8 08/28/2016 1350   PLT 245 09/23/2019 1317   PLT 284 08/28/2016 1350   MCV 92.4 09/23/2019 1317   MCV 90 08/28/2016 1350   MCH 31.4 09/23/2019 1317   MCHC 34.0 09/23/2019 1317   RDW 12.9 09/23/2019 1317   RDW 13.8 08/28/2016 1350   LYMPHSABS 1,073 09/23/2019 1317   LYMPHSABS 2.6 08/28/2016 1350   MONOABS 424 02/09/2017 1112   EOSABS 62 09/23/2019 1317   EOSABS 0.1 08/28/2016 1350   BASOSABS 9 09/23/2019 1317   BASOSABS 0.0 08/28/2016 1350    Baseline Immunosuppressant Therapy Labs TB GOLD Quantiferon TB Gold Latest Ref Rng & Units 10/21/2019  Quantiferon TB Gold Plus NEGATIVE NEGATIVE   Hepatitis Panel Hepatitis Latest Ref Rng & Units 10/21/2019  Hep B Surface Ag NON-REACTI NON-REACTIVE  Hep B IgM NON-REACTI NON-REACTIVE  Hep C Ab NON-REACTI NON-REACTIVE  Hep C Ab NON-REACTI NON-REACTIVE  Hep A IgM NON-REACTI NON-REACTIVE   HIV Lab Results   Component Value Date   HIV NON-REACTIVE 10/21/2019   Immunoglobulins Immunoglobulin Electrophoresis Latest Ref Rng & Units 10/21/2019  IgA  47 - 310 mg/dL 223  IgG 600 - 1,640 mg/dL 1,169  IgM 50 - 300 mg/dL 71   SPEP Serum Protein Electrophoresis Latest Ref Rng & Units 10/21/2019  Total Protein 6.1 - 8.1 g/dL 7.8  Albumin 3.8 - 4.8 g/dL 4.8  Alpha-1 0.2 - 0.3 g/dL 0.3  Alpha-2 0.5 - 0.9 g/dL 0.8  Beta Globulin 0.4 - 0.6 g/dL 0.5  Beta 2 0.2 - 0.5 g/dL 0.4  Gamma Globulin 0.8 - 1.7 g/dL 1.1   G6PD No results found for: G6PDH TPMT No results found for: TPMT   Chest x-ray: updated results pending  Patient running a fever or have signs/symptoms of infection? No  Assessment/Plan:  Demonstrated proper injection technique with demo pen.  Patient able to demonstrate proper injection technique using the teach back method.  Patient self injected in the left lower abdomen with:  Sample Medication: Enbrel Mini NDC: 24401-027-25 Lot: [id number] Expiration: 8/22  She was also given a sample with the same lot/expiration in case of shipping delays.  Patient tolerated well.  Observed for 30 mins in office for adverse reaction  and none noted.   Patient is to return in 1 month for follow up appointment and labs.  Standing orders placed. Prescription sent to [facility] required per insurance.  Unable to determine co-pay as she is locked into [facility] pharmacy but instructed patient to call if she is unable to afford and we can determine if she would qualify for patient assistance.  All questions encouraged and answered.  Instructed patient to call with any further questions or concerns.  Verlin Fester, PharmD, [facility] Clinical Pharmacist  10/30/2019 9:23 AM

## 2019-10-30 NOTE — Patient Instructions (Signed)
Remember the 5 C's:  COUNTER- leave on the counter at least 30 mins but up to overnight to bring medication to room temperature and prevent stinging  COLD- Placing something cold (like and ice gel pack or cold water bottle) on the injection site just before cleansing with alcohol may help reduce pain  CLARITIN- for the first two weeks of treatment or  the day of, the day before, and the day after injecting to minimize injection site reactions  CORTISONE CREAM- apply if injection site is irritated and itching  CALL ME- if injection site reaction is bigger than the size of your fist, looks infected, blisters, or develop hives   

## 2019-11-03 ENCOUNTER — Telehealth: Payer: Self-pay | Admitting: *Deleted

## 2019-11-03 NOTE — Telephone Encounter (Signed)
Error

## 2019-11-04 ENCOUNTER — Telehealth: Payer: Self-pay | Admitting: Rheumatology

## 2019-11-04 NOTE — Telephone Encounter (Signed)
Patient called concerning her prescription of Enbrel.  Patient states she spoke with [facility] who told her additional information was needed and faxed to the doctor's office.  Patient states they are waiting for a response.  Patient states [facility] gave her the number for the office to call.  [phone]

## 2019-11-04 NOTE — Telephone Encounter (Signed)
Called [facility] home delivery and clarified Enbrel Prescription with pharmacy technician Mallory Shirk, they will scheduled refill for patient. Called patient and advised.  2:33 PM Dorthula Nettles, CPhT

## 2019-11-06 NOTE — Telephone Encounter (Signed)
Patient picked up her Enbrel Autotouch on Tuesday.  Nothing further needed.   Verlin Fester, PharmD, [place], CPP Clinical Specialty Pharmacist [phone]  11/06/2019 11:06 AM

## 2019-11-21 NOTE — Progress Notes (Deleted)
Office Visit Note  Patient: Mary Lane             Date of Birth: Aug 21, 1960           MRN: [id number]             PCP: Lynn Ito, MD Referring: Lynn Ito, MD Visit Date: 12/02/2019 Occupation: @GUAROCC @  Subjective:  No chief complaint on file.   History of Present Illness: Mary Lane is a 59 y.o. female ***   Activities of Daily Living:  Patient reports morning stiffness for *** {minute/hour:19697}.   Patient {ACTIONS;DENIES/REPORTS:21021675::"Denies"} nocturnal pain.  Difficulty dressing/grooming: {ACTIONS;DENIES/REPORTS:21021675::"Denies"} Difficulty climbing stairs: {ACTIONS;DENIES/REPORTS:21021675::"Denies"} Difficulty getting out of chair: {ACTIONS;DENIES/REPORTS:21021675::"Denies"} Difficulty using hands for taps, buttons, cutlery, and/or writing: {ACTIONS;DENIES/REPORTS:21021675::"Denies"}  No Rheumatology ROS completed.   PMFS History:  Patient Active Problem List   Diagnosis Date Noted  . Rheumatoid arthritis with rheumatoid factor of multiple sites without organ or systems involvement (HCC) 09/25/2017  . Raynaud's syndrome without gangrene 09/25/2017  . Medication monitoring encounter 09/25/2017  . Primary osteoarthritis of both hands 09/25/2017  . Primary osteoarthritis of both knees 09/25/2017  . DDD (degenerative disc disease), lumbar 09/25/2017  . Primary insomnia 09/25/2017  . Fibromyalgia 09/25/2017  . Mouth sores 09/25/2017  . Essential hypertension 09/25/2017  . History of gastroesophageal reflux (GERD) 09/25/2017  . History of depression 09/25/2017  . Vitamin D deficiency 09/25/2017  . History of hypercholesterolemia 09/25/2017  . High risk medication use 09/06/2016    No past medical history on file.  Family History  Problem Relation Age of Onset  . Hypertension Mother   . Prostate cancer Father   . Hypertension Father   . Hypertension Sister   . Hypercholesterolemia Sister   . Bipolar disorder Sister   . Depression Sister   .  Healthy Daughter   . Healthy Son    Past Surgical History:  Procedure Laterality Date  . ABDOMINAL HYSTERECTOMY    . APPENDECTOMY    . CARPAL TUNNEL RELEASE Bilateral   . CHOLECYSTECTOMY    . ERCP    . KNEE SURGERY Bilateral   . TUBAL LIGATION     Social History   Social History Narrative  . Not on file   Immunization History  Administered Date(s) Administered  . Zoster 09/02/2013     Objective: Vital Signs: There were no vitals taken for this visit.   Physical Exam   Musculoskeletal Exam: ***  CDAI Exam: CDAI Score: -- Patient Global: --; Provider Global: -- Swollen: --; Tender: -- Joint Exam 12/02/2019   No joint exam has been documented for this visit   There is currently no information documented on the homunculus. Go to the Rheumatology activity and complete the homunculus joint exam.  Investigation: No additional findings.  Imaging: No results found.  Recent Labs: Lab Results  Component Value Date   WBC 3.1 (L) 09/23/2019   HGB 12.0 09/23/2019   PLT 245 09/23/2019   NA 134 (L) 09/23/2019   K 4.4 09/23/2019   CL 97 (L) 09/23/2019   CO2 30 09/23/2019   GLUCOSE 82 09/23/2019   BUN 11 09/23/2019   CREATININE 0.63 09/23/2019   BILITOT 0.3 09/23/2019   ALKPHOS 52 08/28/2016   AST 29 09/23/2019   ALT 26 09/23/2019   PROT 7.8 10/21/2019   ALBUMIN 4.6 08/28/2016   CALCIUM 9.9 09/23/2019   GFRAA 113 09/23/2019   QFTBGOLDPLUS NEGATIVE 10/21/2019    Speciality Comments: PLQ Eye Exam: 10/22/17 WNL   Procedures:  No procedures performed Allergies: Baclofen, Gabapentin, Ivp dye [iodinated diagnostic agents], and Sulfa antibiotics   Assessment / Plan:     Visit Diagnoses: No diagnosis found.  Orders: No orders of the defined types were placed in this encounter.  No orders of the defined types were placed in this encounter.   Face-to-face time spent with patient was *** minutes. Greater than 50% of time was spent in counseling and coordination  of care.  Follow-Up Instructions: No follow-ups on file.   Mary Neas, PA-C  Note - This record has been created using Dragon software.  Chart creation errors have been sought, but may not always  have been located. Such creation errors do not reflect on  the standard of medical care.

## 2019-12-02 ENCOUNTER — Ambulatory Visit: Payer: Medicare Other | Admitting: Physician Assistant

## 2019-12-02 ENCOUNTER — Other Ambulatory Visit: Payer: Self-pay | Admitting: Rheumatology

## 2019-12-02 NOTE — Telephone Encounter (Signed)
Yes,patient should hold off methotrexate.

## 2019-12-02 NOTE — Telephone Encounter (Signed)
Called patient and advised she should hold off on taking methotrexate and patient verbalized understanding. Patent states she has not taken the methotrexate since her last office visit.

## 2019-12-02 NOTE — Telephone Encounter (Signed)
Last Visit: 10/21/2019 Next Visit: 12/11/2019 Labs: 09/23/2019 CMP is stable. Hel[place] cell count is mildly decreased.We will monitor labs every 3 months.   Per office note on 10/21/2019, My plan will be to take her off methotrexate while she starts Enbrel because of neutropenia.    Patient started enbrel on 10/30/2019 and will come into the office for a follow up and labs on 12/11/2019  Do you want to hold off on methotrexate or okay to refill?

## 2019-12-09 NOTE — Progress Notes (Signed)
Office Visit Note  Patient: Mary Lane             Date of Birth: [birth year]           MRN: [id number]             PCP: Sinclair Ship, MD Referring: Sinclair Ship, MD Visit Date: 12/11/2019 Occupation: @GUAROCC @  Subjective:  Medication monitoring  History of Present Illness: Mary Lane is a 59 y.o. female with history of seropositive rheumatoid arthritis, osteoarthritis, and fibromyalgia.  Patient was started on Enbrel 50 mg subcutaneous injections once weekly on 10/30/19.  She has had 5 injections so far.  She denies any injection site reactions or side effects.  She discontinued Plaquenil methotrexate due to low Ruby blood cell count.  She states that she has noticed improvement since starting on Enbrel..  She states that her arthralgias and joint stiffness have improved.  She has been able to perform yard work due to having less discomfort.  She takes Tylenol as needed for pain relief.     Activities of Daily Living:  Patient reports morning stiffness for 1 hour.   Patient Reports nocturnal pain.  Difficulty dressing/grooming: Denies Difficulty climbing stairs: Denies Difficulty getting out of chair: Denies Difficulty using hands for taps, buttons, cutlery, and/or writing: Reports  Review of Systems  Constitutional: Negative for fatigue.  HENT: Positive for mouth dryness. Negative for mouth sores and nose dryness.   Eyes: Positive for dryness. Negative for pain and visual disturbance.  Respiratory: Negative for cough, hemoptysis, shortness of breath and difficulty breathing.   Cardiovascular: Negative for chest pain, palpitations, hypertension and swelling in legs/feet.  Gastrointestinal: Positive for constipation. Negative for blood in stool and diarrhea.  Endocrine: Negative for increased urination.  Genitourinary: Negative for difficulty urinating and painful urination.  Musculoskeletal: Positive for arthralgias, joint pain, muscle weakness, morning stiffness and muscle  tenderness. Negative for joint swelling, myalgias and myalgias.  Skin: Positive for rash. Negative for color change, pallor, hair loss, nodules/bumps, skin tightness, ulcers and sensitivity to sunlight.  Allergic/Immunologic: Negative for susceptible to infections.  Neurological: Negative for dizziness, numbness and headaches.  Hematological: Negative for bruising/bleeding tendency and swollen glands.  Psychiatric/Behavioral: Positive for sleep disturbance. Negative for depressed mood. The patient is not nervous/anxious.     PMFS History:  Patient Active Problem List   Diagnosis Date Noted  . Rheumatoid arthritis with rheumatoid factor of multiple sites without organ or systems involvement ([place]) 09/25/2017  . Raynaud's syndrome without gangrene 09/25/2017  . Medication monitoring encounter 09/25/2017  . Primary osteoarthritis of both hands 09/25/2017  . Primary osteoarthritis of both knees 09/25/2017  . DDD (degenerative disc disease), lumbar 09/25/2017  . Primary insomnia 09/25/2017  . Fibromyalgia 09/25/2017  . Mouth sores 09/25/2017  . Essential hypertension 09/25/2017  . History of gastroesophageal reflux (GERD) 09/25/2017  . History of depression 09/25/2017  . Vitamin D deficiency 09/25/2017  . History of hypercholesterolemia 09/25/2017  . High risk medication use 09/06/2016    Past Medical History:  Diagnosis Date  . Allergies   . Chronic GERD   . DDD (degenerative disc disease), cervical   . DDD (degenerative disc disease), lumbar   . DDD (degenerative disc disease), thoracic   . Depression   . Fibromyalgia   . High cholesterol   . Hypertension   . Klippel-Feil syndrome   . Low sodium levels   . Osteoarthritis   . Raynaud disease   . Rheumatoid arthritis ([place])   . Shoulder  pain, right   . Sjogren's disease (HCC)   . TMJ disease     Family History  Problem Relation Age of Onset  . Hypertension Mother   . Prostate cancer Father   . Hypertension Father   .  Hypertension Sister   . Hypercholesterolemia Sister   . Bipolar disorder Sister   . Depression Sister   . Healthy Daughter   . Healthy Son    Past Surgical History:  Procedure Laterality Date  . ABDOMINAL HYSTERECTOMY    . APPENDECTOMY    . BACK SURGERY    . CARPAL TUNNEL RELEASE Bilateral   . CHOLECYSTECTOMY    . ERCP    . KNEE SURGERY Bilateral   . TUBAL LIGATION     Social History   Social History Narrative  . Not on file   Immunization History  Administered Date(s) Administered  . Zoster 09/02/2013     Objective: Vital Signs: BP 110/66 (BP Location: Left Arm, Patient Position: Sitting, Cuff Size: Normal)   Pulse 74   Resp 16   Ht 5\' 4"  (1.626 m)   Wt 130 lb 6.4 oz (59.1 kg)   BMI 22.38 kg/m    Physical Exam Vitals and nursing note reviewed.  Constitutional:      Appearance: She is well-developed.  HENT:     Head: Normocephalic and atraumatic.  Eyes:     Conjunctiva/sclera: Conjunctivae normal.  Pulmonary:     Effort: Pulmonary effort is normal.  Abdominal:     General: Bowel sounds are normal.     Palpations: Abdomen is soft.  Musculoskeletal:     Cervical back: Normal range of motion.  Lymphadenopathy:     Cervical: No cervical adenopathy.  Skin:    General: Skin is warm and dry.     Capillary Refill: Capillary refill takes less than 2 seconds.  Neurological:     Mental Status: She is alert and oriented to person, place, and time.  Psychiatric:        Behavior: Behavior normal.      Musculoskeletal Exam: C-spine, thoracic spine, lumbar spine good range of motion.  No midline spinal tenderness.  No SI joint tenderness.  Shoulder joints, elbow joints, wrist joints, MCPs, PIPs and DIPs good range of motion with no synovitis.  She has PIP and DIP thickening consistent with osteoarthritis of both hands.  She has complete fist formation bilaterally.  Hip joints have good range of motion with no discomfort at this time.  Knee joints have good range of  motion with no warmth or effusion.  Ankle joints have good range of motion no tenderness or inflammation.  She has tenderness of several MTP joints.  CDAI Exam: CDAI Score: 8  Patient Global: 5 mm; Provider Global: 5 mm Swollen: 0 ; Tender: 13  Joint Exam 12/11/2019      Right  Left  Glenohumeral   Tender   Tender  MCP 2   Tender   Tender  MCP 3   Tender   Tender  MCP 4   Tender     MTP 1   Tender   Tender  MTP 2   Tender   Tender  MTP 3   Tender     MTP 5      Tender     Investigation: No additional findings.  Imaging: No results found.  Recent Labs: Lab Results  Component Value Date   WBC 3.1 (L) 09/23/2019   HGB 12.0 09/23/2019   PLT 245  09/23/2019   NA 134 (L) 09/23/2019   K 4.4 09/23/2019   CL 97 (L) 09/23/2019   CO2 30 09/23/2019   GLUCOSE 82 09/23/2019   BUN 11 09/23/2019   CREATININE 0.63 09/23/2019   BILITOT 0.3 09/23/2019   ALKPHOS 52 08/28/2016   AST 29 09/23/2019   ALT 26 09/23/2019   PROT 7.8 10/21/2019   ALBUMIN 4.6 08/28/2016   CALCIUM 9.9 09/23/2019   GFRAA 113 09/23/2019   QFTBGOLDPLUS NEGATIVE 10/21/2019    Speciality Comments: PLQ Eye Exam: 10/22/17 WNL   Procedures:  No procedures performed Allergies: Baclofen, Gabapentin, Ivp dye [iodinated diagnostic agents], and Sulfa antibiotics   Assessment / Plan:     Visit Diagnoses: Rheumatoid arthritis with rheumatoid factor of multiple sites without organ or systems involvement (HCC) - +RF, +ANA: She has no synovitis on exam.  She has tenderness of several MCP and MTP joints as described above.  She continues to experience mild discomfort and stiffness in both shoulders, both hands, and both feet.  She has noticed a significant improvement in her symptoms since starting on Enbrel 50 mg sq injections once weekly on 10/30/2019.  She has been tolerating Enbrel and has not had any injection site reactions.  She was previously on Plaquenil but discontinued due to low [place] blood cell count.  She was then  started on low-dose methotrexate but continued to have increased pain and stiffness so she was switched from methotrexate to Enbrel.  She will continue on Enbrel 50 mg subcu injections once weekly as monotherapy. We will follow her [place] blood cell count closely.  Her [place] blood cell count was 2.7 on 11/28/2019.  She will repeat CBC in 1 month.  She was advised to notify us if she develops increased joint pain or joint swelling.  She will follow-up in the office in 3 months.  High risk medication use -Enbrel 50 mg subcutaneous injections once weekly-started on 10/30/2019.  Inadequate response to the low-dose methotrexate.  Discontinued Plaquenil due to low [place] blood cell count in the past.  CBC and CMP were drawn on 11/28/2019.  B[place] blood cell count was 2.7 at that time.  We will continue to monitor [place] blood cell count closely.  Standing orders for CBC and CMP are in place TB gold was negative on 10/21/2019. We discussed the importance of holding Enbrel if she develops any signs or symptoms of an infection and to resume once the infection is completely cleared.  We also discussed the importance of yearly skin exams while on Enbrel.  Primary osteoarthritis of both hands: She has PIP and DIP thickening consistent with osteoarthritis of both hands.  She continues to experience pain and stiffness in both hands but has noticed improvements since switching to Enbrel.  Joint protection and muscle strengthening were discussed.  Primary osteoarthritis of both knees: She has good range of motion of both knee joints.  No warmth or effusion was noted.  Raynaud's syndrome without gangrene: She has not had any symptoms of Raynaud's recently.  No digital ulcerations or signs of gangrene were noted.  She has good capillary refill on exam.  Fibromyalgia: She experiences intermittent myalgias and muscle tenderness due to fibromyalgia.  Overall her discomfort has been tolerable.  She takes Tylenol as needed for pain  relief.  She has been more active recently since switching to Enbrel which has been helpful.  Discussed importance of regular exercise and good sleep hygiene.  DDD (degenerative disc disease), cervical: She has good range  of motion with no discomfort at this time.  No symptoms of radiculopathy.  DDD (degenerative disc disease), lumbar: She is not experiencing any lower back pain currently.  No midline spinal tenderness or SI joint tenderness noted.  She has no symptoms of radiculopathy.  Other medical conditions are listed as follows:  Primary insomnia  History of vitamin D deficiency  History of depression  History of hypercholesterolemia  History of gastroesophageal reflux (GERD)  History of hypertension  Orders: No orders of the defined types were placed in this encounter.  No orders of the defined types were placed in this encounter.    Follow-Up Instructions: Return in about 3 months (around 03/11/2020) for Rheumatoid arthritis, Osteoarthritis, Fibromyalgia.   Gearldine Bienenstock, PA-C  Note - This record has been created using Dragon software.  Chart creation errors have been sought, but may not always  have been located. Such creation errors do not reflect on  the standard of medical care.

## 2019-12-10 ENCOUNTER — Other Ambulatory Visit: Payer: Self-pay | Admitting: Rheumatology

## 2019-12-10 DIAGNOSIS — M0579 Rheumatoid arthritis with rheumatoid factor of multiple sites without organ or systems involvement: Secondary | ICD-10-CM

## 2019-12-11 ENCOUNTER — Ambulatory Visit (INDEPENDENT_AMBULATORY_CARE_PROVIDER_SITE_OTHER): Payer: Medicare Other | Admitting: Physician Assistant

## 2019-12-11 ENCOUNTER — Encounter: Payer: Self-pay | Admitting: Physician Assistant

## 2019-12-11 ENCOUNTER — Other Ambulatory Visit: Payer: Self-pay

## 2019-12-11 VITALS — BP 110/66 | HR 74 | Resp 16 | Ht 64.0 in | Wt 130.4 lb

## 2019-12-11 DIAGNOSIS — Z8679 Personal history of other diseases of the circulatory system: Secondary | ICD-10-CM

## 2019-12-11 DIAGNOSIS — Z8639 Personal history of other endocrine, nutritional and metabolic disease: Secondary | ICD-10-CM

## 2019-12-11 DIAGNOSIS — M19041 Primary osteoarthritis, right hand: Secondary | ICD-10-CM | POA: Diagnosis not present

## 2019-12-11 DIAGNOSIS — M170 Bilateral primary osteoarthritis of knee: Secondary | ICD-10-CM

## 2019-12-11 DIAGNOSIS — Z8659 Personal history of other mental and behavioral disorders: Secondary | ICD-10-CM

## 2019-12-11 DIAGNOSIS — M797 Fibromyalgia: Secondary | ICD-10-CM

## 2019-12-11 DIAGNOSIS — M5136 Other intervertebral disc degeneration, lumbar region: Secondary | ICD-10-CM

## 2019-12-11 DIAGNOSIS — I7300 Raynaud's syndrome without gangrene: Secondary | ICD-10-CM

## 2019-12-11 DIAGNOSIS — F5101 Primary insomnia: Secondary | ICD-10-CM

## 2019-12-11 DIAGNOSIS — M19042 Primary osteoarthritis, left hand: Secondary | ICD-10-CM

## 2019-12-11 DIAGNOSIS — Z79899 Other long term (current) drug therapy: Secondary | ICD-10-CM

## 2019-12-11 DIAGNOSIS — M0579 Rheumatoid arthritis with rheumatoid factor of multiple sites without organ or systems involvement: Secondary | ICD-10-CM | POA: Diagnosis not present

## 2019-12-11 DIAGNOSIS — M51369 Other intervertebral disc degeneration, lumbar region without mention of lumbar back pain or lower extremity pain: Secondary | ICD-10-CM

## 2019-12-11 DIAGNOSIS — M5030 Other cervical disc degeneration, unspecified cervical region: Secondary | ICD-10-CM

## 2019-12-11 DIAGNOSIS — Z8719 Personal history of other diseases of the digestive system: Secondary | ICD-10-CM

## 2019-12-11 NOTE — Patient Instructions (Signed)
Standing Labs We placed an order today for your standing lab work.    Please come back and get your standing labs in May   We have open lab daily Monday through Thursday from 8:30-12:30 PM and 1:30-4:30 PM and Friday from 8:30-12:30 PM and 1:30-4:00 PM at the office of Dr. Pollyann Savoy.   You may experience shorter wait times on Monday and Friday afternoons. The office is located at [address], [address], [place], [place] [address] No appointment is necessary.   Labs are drawn by [facility].  You may receive a bill from [facility] for your lab work.  If you wish to have your labs drawn at another location, please call the office 24 hours in advance to send orders.  If you have any questions regarding directions or hours of operation,  please call [phone].   Just as a reminder please drink plenty of water prior to coming for your lab work. Thanks!

## 2020-01-02 ENCOUNTER — Other Ambulatory Visit: Payer: Self-pay | Admitting: Rheumatology

## 2020-01-02 NOTE — Telephone Encounter (Signed)
Last Visit: 12/08/2019 Next Visit: 03/11/2020 UDS:10/21/2019 c/w Narc Agreement: 10/21/2019  Last Fill:10/23/2019 (tramadol #[id number])  Okay to refill robaxin and tramadol?

## 2020-01-05 ENCOUNTER — Other Ambulatory Visit: Payer: Self-pay

## 2020-01-05 DIAGNOSIS — Z79899 Other long term (current) drug therapy: Secondary | ICD-10-CM

## 2020-01-05 LAB — COMPLETE METABOLIC PANEL WITH GFR
AG Ratio: 1.9 (calc) (ref 1.0–2.5)
ALT: 16 U/L (ref 6–29)
AST: 24 U/L (ref 10–35)
Albumin: 4.7 g/dL (ref 3.6–5.1)
Alkaline phosphatase (APISO): 44 U/L (ref 37–153)
BUN: 12 mg/dL (ref 7–25)
CO2: 33 mmol/L — ABNORMAL HIGH (ref 20–32)
Calcium: 9.9 mg/dL (ref 8.6–10.4)
Chloride: 95 mmol/L — ABNORMAL LOW (ref 98–110)
Creat: 0.65 mg/dL (ref 0.50–0.99)
GFR, Est African American: 112 mL/min/{1.73_m2} (ref >=60)
GFR, Est Non African American: 96 mL/min/{1.73_m2} (ref >=60)
Globulin: 2.5 g/dL (calc) (ref 1.9–3.7)
Glucose, Bld: 91 mg/dL (ref 65–99)
Potassium: 3.6 mmol/L (ref 3.5–5.3)
Sodium: 133 mmol/L — ABNORMAL LOW (ref 135–146)
Total Bilirubin: 0.4 mg/dL (ref 0.2–1.2)
Total Protein: 7.2 g/dL (ref 6.1–8.1)

## 2020-01-05 LAB — CBC WITH DIFFERENTIAL/PLATELET
Absolute Monocytes: 363 cells/uL (ref 200–950)
Basophils Absolute: 10 cells/uL (ref 0–200)
Basophils Relative: 0.4 %
Eosinophils Absolute: 50 cells/uL (ref 15–500)
Eosinophils Relative: 2 %
HCT: 35.6 % (ref 35.0–45.0)
Hemoglobin: 11.5 g/dL — ABNORMAL LOW (ref 11.7–15.5)
Lymphs Abs: 1165 cells/uL (ref 850–3900)
MCH: 29.7 pg (ref 27.0–33.0)
MCHC: 32.3 g/dL (ref 32.0–36.0)
MCV: 92 fL (ref 80.0–100.0)
MPV: 11.3 fL (ref 7.5–12.5)
Monocytes Relative: 14.5 %
Neutro Abs: 913 cells/uL — ABNORMAL LOW (ref 1500–7800)
Neutrophils Relative %: 36.5 %
Platelets: 195 10*3/uL (ref 140–400)
RBC: 3.87 10*6/uL (ref 3.80–5.10)
RDW: 12.8 % (ref 11.0–15.0)
Total Lymphocyte: 46.6 %
WBC: 2.5 10*3/uL — ABNORMAL LOW (ref 3.8–10.8)

## 2020-01-06 ENCOUNTER — Telehealth: Payer: Self-pay | Admitting: *Deleted

## 2020-01-06 DIAGNOSIS — Z79899 Other long term (current) drug therapy: Secondary | ICD-10-CM

## 2020-01-06 NOTE — Telephone Encounter (Signed)
-----   Message from Pollyann Savoy, MD sent at 01/06/2020 12:17 PM EDT ----- Please advise patient to hold Enbrel and repeat WBC count in 2 weeks.  If her WBC count recovers we can start Enbrel 50 mg subcu every other week.

## 2020-01-06 NOTE — Progress Notes (Signed)
Please advise patient to hold Enbrel and repeat WBC count in 2 weeks.  If her WBC count recovers we can start Enbrel 50 mg subcu every other week.

## 2020-01-06 NOTE — Telephone Encounter (Signed)
Future order placed for labs.

## 2020-01-19 ENCOUNTER — Other Ambulatory Visit: Payer: Self-pay

## 2020-01-19 ENCOUNTER — Other Ambulatory Visit: Payer: Self-pay | Admitting: *Deleted

## 2020-01-19 DIAGNOSIS — Z79899 Other long term (current) drug therapy: Secondary | ICD-10-CM

## 2020-01-19 LAB — COMPLETE METABOLIC PANEL WITH GFR
AG Ratio: 1.8 (calc) (ref 1.0–2.5)
ALT: 15 U/L (ref 6–29)
AST: 20 U/L (ref 10–35)
Albumin: 4.4 g/dL (ref 3.6–5.1)
Alkaline phosphatase (APISO): 42 U/L (ref 37–153)
BUN: 12 mg/dL (ref 7–25)
CO2: 32 mmol/L (ref 20–32)
Calcium: 9.8 mg/dL (ref 8.6–10.4)
Chloride: 97 mmol/L — ABNORMAL LOW (ref 98–110)
Creat: 0.64 mg/dL (ref 0.50–0.99)
GFR, Est African American: 112 mL/min/{1.73_m2} (ref >=60)
GFR, Est Non African American: 97 mL/min/{1.73_m2} (ref >=60)
Globulin: 2.5 g/dL (calc) (ref 1.9–3.7)
Glucose, Bld: 75 mg/dL (ref 65–99)
Potassium: 3.8 mmol/L (ref 3.5–5.3)
Sodium: 135 mmol/L (ref 135–146)
Total Bilirubin: 0.4 mg/dL (ref 0.2–1.2)
Total Protein: 6.9 g/dL (ref 6.1–8.1)

## 2020-01-19 LAB — CBC WITH DIFFERENTIAL/PLATELET
Absolute Monocytes: 357 cells/uL (ref 200–950)
Basophils Absolute: 19 cells/uL (ref 0–200)
Basophils Relative: 0.6 %
Eosinophils Absolute: 59 cells/uL (ref 15–500)
Eosinophils Relative: 1.9 %
HCT: 34.2 % — ABNORMAL LOW (ref 35.0–45.0)
Hemoglobin: 11.3 g/dL — ABNORMAL LOW (ref 11.7–15.5)
Lymphs Abs: 1203 cells/uL (ref 850–3900)
MCH: 30.1 pg (ref 27.0–33.0)
MCHC: 33 g/dL (ref 32.0–36.0)
MCV: 91.2 fL (ref 80.0–100.0)
MPV: 10.9 fL (ref 7.5–12.5)
Monocytes Relative: 11.5 %
Neutro Abs: 1463 cells/uL — ABNORMAL LOW (ref 1500–7800)
Neutrophils Relative %: 47.2 %
Platelets: 207 10*3/uL (ref 140–400)
RBC: 3.75 10*6/uL — ABNORMAL LOW (ref 3.80–5.10)
RDW: 12.5 % (ref 11.0–15.0)
Total Lymphocyte: 38.8 %
WBC: 3.1 10*3/uL — ABNORMAL LOW (ref 3.8–10.8)

## 2020-01-20 NOTE — Progress Notes (Signed)
CMP is stable.  WBC count has improved but not normal.  Patient may try to space Enbrel every 2 weeks.  Repeat CBC in 1 month.

## 2020-01-21 ENCOUNTER — Other Ambulatory Visit: Payer: Self-pay | Admitting: *Deleted

## 2020-01-21 DIAGNOSIS — R899 Unspecified abnormal finding in specimens from other organs, systems and tissues: Secondary | ICD-10-CM

## 2020-01-21 DIAGNOSIS — R5383 Other fatigue: Secondary | ICD-10-CM

## 2020-01-21 DIAGNOSIS — Z79899 Other long term (current) drug therapy: Secondary | ICD-10-CM

## 2020-01-21 DIAGNOSIS — M2550 Pain in unspecified joint: Secondary | ICD-10-CM

## 2020-02-27 NOTE — Progress Notes (Signed)
Office Visit Note  Patient: Mary Lane             Date of Birth: 1960/10/16           MRN: [id number]             PCP: Lynn Ito, MD Referring: Lynn Ito, MD Visit Date: 03/11/2020 Occupation: @GUAROCC @  Subjective:  Rash   History of Present Illness: Mary Lane is a 59 y.o. female with history of rheumatoid arthritis, osteoarthritis, and fibromyalgia.  She is on enbrel 50 mg sq injections once every 14 days.  She has been spacing enbrel due to low WBC count.  She states she has noticed increased joint stiffness and discomfort since spacing enbrel.  She denies any joint swelling currently.   She noticed a rash developing on her arms and chest in sun exposed areas starting in February 2021. She has been wearing sunscreen on a daily basis and trying to avoid direct sun exposure. She has not used any new products.  She denies any known triggers. She states she was evaluated at urgent care and was prescribed an antibiotic and triamcinolone cream.  She states the triamcinolone cream helped temporarily but she has run out of the prescription. She has been using hydrocortisone cream, but she states the rash seems to progressively be worsening.  She has an upcoming appointment with her dermatologist on Tuesday.    Activities of Daily Living:  Patient reports morning stiffness for 1 hour.   Patient Reports nocturnal pain.  Difficulty dressing/grooming: Denies Difficulty climbing stairs: Denies Difficulty getting out of chair: Denies Difficulty using hands for taps, buttons, cutlery, and/or writing: Reports  Review of Systems  Constitutional: Positive for fatigue.  HENT: Positive for mouth dryness and nose dryness. Negative for mouth sores.   Eyes: Positive for dryness. Negative for pain, itching and visual disturbance.  Respiratory: Negative for cough, hemoptysis, shortness of breath and difficulty breathing.   Cardiovascular: Negative for chest pain, palpitations, hypertension and  swelling in legs/feet.  Gastrointestinal: Negative for blood in stool, constipation and diarrhea.  Endocrine: Negative for increased urination.  Genitourinary: Negative for difficulty urinating and painful urination.  Musculoskeletal: Positive for arthralgias, joint pain, joint swelling, myalgias, morning stiffness, muscle tenderness and myalgias. Negative for muscle weakness.  Skin: Positive for rash. Negative for color change, pallor, hair loss, nodules/bumps, skin tightness, ulcers and sensitivity to sunlight.  Allergic/Immunologic: Negative for susceptible to infections.  Neurological: Positive for weakness. Negative for dizziness, numbness, headaches and memory loss.  Hematological: Negative for bruising/bleeding tendency and swollen glands.  Psychiatric/Behavioral: Negative for depressed mood, confusion and sleep disturbance. The patient is not nervous/anxious.     PMFS History:  Patient Active Problem List   Diagnosis Date Noted  . Rheumatoid arthritis with rheumatoid factor of multiple sites without organ or systems involvement (HCC) 09/25/2017  . Raynaud's syndrome without gangrene 09/25/2017  . Medication monitoring encounter 09/25/2017  . Primary osteoarthritis of both hands 09/25/2017  . Primary osteoarthritis of both knees 09/25/2017  . DDD (degenerative disc disease), lumbar 09/25/2017  . Primary insomnia 09/25/2017  . Fibromyalgia 09/25/2017  . Mouth sores 09/25/2017  . Essential hypertension 09/25/2017  . History of gastroesophageal reflux (GERD) 09/25/2017  . History of depression 09/25/2017  . Vitamin D deficiency 09/25/2017  . History of hypercholesterolemia 09/25/2017  . High risk medication use 09/06/2016    Past Medical History:  Diagnosis Date  . Allergies   . Chronic GERD   . DDD (degenerative disc disease),  cervical   . DDD (degenerative disc disease), lumbar   . DDD (degenerative disc disease), thoracic   . Depression   . Fibromyalgia   . High  cholesterol   . Hypertension   . Klippel-Feil syndrome   . Low sodium levels   . Osteoarthritis   . Raynaud disease   . Rheumatoid arthritis (HCC)   . Shoulder pain, right   . Sjogren's disease (HCC)   . TMJ disease     Family History  Problem Relation Age of Onset  . Hypertension Mother   . Prostate cancer Father   . Hypertension Father   . Hypertension Sister   . Hypercholesterolemia Sister   . Bipolar disorder Sister   . Depression Sister   . Healthy Daughter   . Healthy Son    Past Surgical History:  Procedure Laterality Date  . ABDOMINAL HYSTERECTOMY    . APPENDECTOMY    . BACK SURGERY    . CARPAL TUNNEL RELEASE Bilateral   . CHOLECYSTECTOMY    . ERCP    . KNEE SURGERY Bilateral   . TUBAL LIGATION     Social History   Social History Narrative  . Not on file   Immunization History  Administered Date(s) Administered  . Zoster 09/02/2013     Objective: Vital Signs: BP 119/71 (BP Location: Left Arm, Patient Position: Sitting, Cuff Size: Normal)   Pulse 78   Resp 14   Ht 5' 4.75" (1.645 m)   Wt 131 lb 9.6 oz (59.7 kg)   BMI 22.07 kg/m    Physical Exam Vitals and nursing note reviewed.  Constitutional:      Appearance: She is well-developed.  HENT:     Head: Normocephalic and atraumatic.  Eyes:     Conjunctiva/sclera: Conjunctivae normal.  Pulmonary:     Effort: Pulmonary effort is normal.  Abdominal:     General: Bowel sounds are normal.     Palpations: Abdomen is soft.  Musculoskeletal:     Cervical back: Normal range of motion.  Lymphadenopathy:     Cervical: No cervical adenopathy.  Skin:    General: Skin is warm and dry.     Capillary Refill: Capillary refill takes less than 2 seconds.     Comments: Erythematous macular rash on chest, dorsal aspect of both arms, and anterior surface of both thighs.   Neurological:     Mental Status: She is alert and oriented to person, place, and time.  Psychiatric:        Behavior: Behavior normal.       Musculoskeletal Exam: C-spine has good range of motion with no discomfort.  She is some discomfort with lumbar range of motion.  No midline spinal tenderness.  Shoulder joints, elbow joints, wrist joints, MCPs and PIPs and DIPs have good range of motion with no synovitis.  She has PIP and DIP thickening consistent with osteoarthritis of both hands.  She has complete fist formation bilaterally.  Hip joints have good range of motion with no discomfort.  Knee joints have good range of motion with no warmth or effusion.  Ankle joints have good range of motion no tenderness or inflammation.  Hallux rigidus noticed bilaterally.  CDAI Exam: CDAI Score: 0.8  Patient Global: 5 mm; Provider Global: 3 mm Swollen: 0 ; Tender: 0  Joint Exam 03/11/2020   No joint exam has been documented for this visit   There is currently no information documented on the homunculus. Go to the Rheumatology activity and complete  the homunculus joint exam.  Investigation: No additional findings.  Imaging: XR Foot 2 Views Left  Result Date: 03/11/2020 First MTP, PIP and DIP narrowing was noted.  No intertarsal or tibiotalar joint space narrowing was noted.  No erosive changes were noted.  No interval change was noted when compared to the x-rays of Dec 18, 2017. Impression: These findings are consistent with osteoarthritis of the foot.  XR Foot 2 Views Right  Result Date: 03/11/2020 First MTP, PIP and DIP narrowing was noted.  No intertarsal or tibiotalar joint space narrowing was noted.  No erosive changes were noted.  No interval change was noted when compared to the x-rays of Dec 18, 2017. Impression: These findings are consistent with osteoarthritis of the foot.  XR Hand 2 View Left  Result Date: 03/11/2020 Juxta-articular osteopenia was noted.  Mild second MCP joint narrowing was noted.  PIP and DIP narrowing was noted.  CMC narrowing was noted.  No intercarpal radiocarpal joint space narrowing was noted.  No  erosive changes were noted.  No interval change was noted when compared to the x-rays of Dec 18, 2017. Impression: These findings are consistent with rheumatoid arthritis and osteoarthritis overlap.  XR Hand 2 View Right  Result Date: 03/11/2020 First MCP narrowing was noted.  Juxta-articular osteopenia was noted.  PIP and DIP narrowing was noted.  No intercarpal or radiocarpal joint space narrowing was noted.  No erosive changes were noted.  No interval change was noted when compared with the x-rays of Dec 18, 2017. Impression: These findings are consistent with rheumatoid arthritis and osteoarthritis overlap.   Recent Labs: Lab Results  Component Value Date   WBC 2.6 (L) 03/01/2020   HGB 12.1 03/01/2020   PLT 219 03/01/2020   NA 135 01/19/2020   K 3.8 01/19/2020   CL 97 (L) 01/19/2020   CO2 32 01/19/2020   GLUCOSE 75 01/19/2020   BUN 12 01/19/2020   CREATININE 0.64 01/19/2020   BILITOT 0.4 01/19/2020   ALKPHOS 52 08/28/2016   AST 20 01/19/2020   ALT 15 01/19/2020   PROT 6.9 01/19/2020   ALBUMIN 4.6 08/28/2016   CALCIUM 9.8 01/19/2020   GFRAA 112 01/19/2020   QFTBGOLDPLUS NEGATIVE 10/21/2019    Speciality Comments: Patient may try to space Enbrel every 2 weeks  Procedures:  No procedures performed Allergies: Baclofen, Gabapentin, Ivp dye [iodinated diagnostic agents], and Sulfa antibiotics   Assessment / Plan:     Visit Diagnoses: Rheumatoid arthritis with rheumatoid factor of multiple sites without organ or systems involvement (HCC) -  +RF, +ANA: She has no synovitis on exam.  She has not had any recent rheumatoid arthritis flares.  She has been having some increased arthralgias and joint stiffness which she attributes to having to space the dose of Enbrel to every 14 days due to history of neutropenia.  Her Quint blood cell count was 2.6 on 03/01/2020.  We will recheck CBC today.  Most of her discomfort seems to be related to underlying osteoarthritis.  X-rays of both hands and  feet were updated today to assess for radiographic progression.  She will continue spacing Enbrel to every 14 days and we will continue to monitor her Duck blood cell count closely.  She was advised to notify us if she develops increased joint pain or joint swelling.  She will follow-up in the office in 3- Plan: XR Hand 2 View Right, XR Hand 2 View Left, XR Foot 2 Views Right, XR Foot 2 Views Left.  No radiographic progression was noted when compared to the x-rays of 2019.  High risk medication use - Enbrel 50 mg subcutaneous injections once weekly. Inadequate response to the low-dose methotrexate.  Discontinued Plaquenil due to low Nylund blood cell count.  TB gold negative on 10/21/2019.  CBC was updated on 03/01/2020.  She has longstanding history of neutropenia.  We will recheck CBC today.  CMP was drawn on 01/19/2020.- Plan: CBC with Differential/Platelet  Medication monitoring encounter - Tramadol 50 mg 1 to 2 tablets BID PRN for pain relief. UDS & narcotic agreement: 10/21/2019.   Primary osteoarthritis of both hands: She has PIP and DIP thickening consistent with osteoarthritis of both hands.  She has complete fist formation bilaterally.  She has been experiencing increased stiffness and discomfort in both hands which she attributes to having to space the dose of Enbrel to every 14 days.  Joint protection and muscle strengthening were discussed.  Primary osteoarthritis of both knees: She has good range of motion of both knee joints on exam.  No warmth or effusion was noted.  Raynaud's syndrome without gangrene -Not currently active.  She has noticed ulcerations or signs of gangrene.  We will obtain the following labs today.  Plan: Anti-scleroderma antibody, Beta-2 glycoprotein antibodies, Cardiolipin antibodies, IgG, IgM, IgA, Lupus Anticoagulant Eval w/Reflex  Fibromyalgia: She experiences intermittent myalgias and muscle tenderness due to underlying fibromyalgia.  She is on Cymbalta 20 mg 1 capsule by  mouth daily.  She experiences intermittent muscle spasms.  She takes Robaxin 500 mg 1 tablet twice daily as needed and Flexeril 5 mg 1 tablet by mouth at bedtime for muscle spasms.  She continues to take tramadol 50 mg 1 to 2 tablets twice daily as needed for pain relief.  DDD (degenerative disc disease), cervical: She has good range of motion with no discomfort at this time.  No symptoms of radiculopathy.  DDD (degenerative disc disease), lumbar: She has some discomfort with range of motion.  No midline spinal tenderness.  Rash and other nonspecific skin eruption -She presents today with a erythematous macular rash on her chest, dorsal aspect of both arms and anterior surface of both thighs.  According to the patient her rash started in February 2021.  At that time she was not using any new products and could not identify a trigger.  She has not taken any new medications.  She has longstanding history of photosensitivity and has been wearing sunscreen on a daily basis and avoiding direct sun exposure.  She was evaluated at urgent care and was prescribed an antibiotic and triamcinolone cream which she applied topically.  According to the patient the triamcinolone cream seems to help the rash but she has since run out of the cream.  The rash seems to progressively be worsening.  She has been using topical hydrocortisone cream which has been ineffective.  Her rash seems to be spreading down to her lower extremities.  She has an upcoming appointment with dermatology on Tuesday and was strongly encouraged to have a biopsy performed to confirm the diagnosis.  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  Plan: ANA, Anti-DNA antibody, double-stranded, C3 and C4, Sedimentation rate, RNP Antibody, Anti-Smith antibody, Anti-scleroderma antibody, Sjogrens syndrome-A extractable nuclear antibody, Sjogrens syndrome-B extractable nuclear antibody, CBC with  Differential/Platelet, Beta-2 glycoprotein antibodies, Cardiolipin antibodies, IgG, IgM, IgA, Lupus Anticoagulant Eval w/Reflex  Neutropenia, unspecified type (HCC) -She has a history of neutropenia.  Her Kratt blood cell  count was 2.6 on 03/01/2020.  She has been spacing Enbrel to every 14 days.  We will recheck CBC today.  We will also check the following autoimmune labs to complete the work-up.  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  She has been wearing sunscreen on a daily basis and avoiding direct sun exposure.  She has developed a rash on the dorsal aspect of both arms, her chest, and the anterior surface of both thighs.  She has an upcoming dermatology appointment on Tuesday.  We encouraged her to have a biopsy performed to confirm the diagnosis.  We will obtain the following labs today.  Plan: ANA, Anti-DNA antibody, double-stranded, C3 and C4, Sedimentation rate, RNP Antibody, Anti-Smith antibody, Anti-scleroderma antibody, Sjogrens syndrome-A extractable nuclear antibody, Sjogrens syndrome-B extractable nuclear antibody  Other medical conditions are listed as follows:  Primary insomnia  History of hypertension  History of hypercholesterolemia  History of vitamin D deficiency  History of gastroesophageal reflux (GERD)  History of depression  Orders: Orders Placed This Encounter  Procedures  . XR Hand 2 View Right  . XR Hand 2 View Left  . XR Foot 2 Views Right  . XR Foot 2 Views Left  . ANA  . Anti-DNA antibody, double-stranded  . C3 and C4  . Sedimentation rate  . RNP  Antibody  . Anti-Smith antibody  . Anti-scleroderma antibody  . Sjogrens syndrome-A extractable nuclear antibody  . Sjogrens syndrome-B extractable nuclear antibody  . CBC with Differential/Platelet  . Beta-2 glycoprotein antibodies  . Cardiolipin antibodies, IgG, IgM, IgA  . Lupus Anticoagulant Eval w/Reflex   No orders of the defined types were placed in this encounter.   Face-to-face time spent with patient was 30 minutes. Greater than 50% of time was spent in counseling and coordination of care.  Follow-Up Instructions: Return in about 3 weeks (around 04/01/2020) for Rheumatoid arthritis, Osteoarthritis, Fibromyalgia.   Sherron Ales, PA-C  I examined and evaluated the patient with Sherron Ales PA.  Patient had extensive rash on her upper extremities, nape of her neck and her lower extremities today.  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  She definitely have the rash in the photosensitive area.  We will obtain autoimmune labs today.  She has appointment coming up with a dermatologist.  We have advised her to get a skin biopsy for evaluation and to establish diagnosis.  She has been experiencing increased discomfort on lower dose of Enbrel.  I did not see any synovitis on examination.  Patient has not received COVID-19 vaccination.  She was advised to get vaccinated.  She was also advised that if she is fully vaccinated she will have to continue wearing mask, practice hand hygiene and social distancing.  In case she develops COVID-19 vaccine she should call our office or PCPs office to check for the eligibility for antibody infusion.  The plan of care was discussed as noted above.  Pollyann Savoy, MD  Note - This record has been created using Animal nutritionist.  Chart creation errors have been sought, but may not always  have been located. Such creation errors do not reflect on  the standard of medical care.

## 2020-03-01 ENCOUNTER — Other Ambulatory Visit: Payer: Self-pay

## 2020-03-01 DIAGNOSIS — Z79899 Other long term (current) drug therapy: Secondary | ICD-10-CM

## 2020-03-01 DIAGNOSIS — R5383 Other fatigue: Secondary | ICD-10-CM

## 2020-03-01 DIAGNOSIS — M2550 Pain in unspecified joint: Secondary | ICD-10-CM

## 2020-03-01 DIAGNOSIS — R899 Unspecified abnormal finding in specimens from other organs, systems and tissues: Secondary | ICD-10-CM

## 2020-03-01 LAB — CBC WITH DIFFERENTIAL/PLATELET
Absolute Monocytes: 400 cells/uL (ref 200–950)
Basophils Absolute: 21 cells/uL (ref 0–200)
Basophils Relative: 0.8 %
Eosinophils Absolute: 81 cells/uL (ref 15–500)
Eosinophils Relative: 3.1 %
HCT: 36.8 % (ref 35.0–45.0)
Hemoglobin: 12.1 g/dL (ref 11.7–15.5)
Lymphs Abs: 910 cells/uL (ref 850–3900)
MCH: 29.8 pg (ref 27.0–33.0)
MCHC: 32.9 g/dL (ref 32.0–36.0)
MCV: 90.6 fL (ref 80.0–100.0)
MPV: 11.6 fL (ref 7.5–12.5)
Monocytes Relative: 15.4 %
Neutro Abs: 1188 cells/uL — ABNORMAL LOW (ref 1500–7800)
Neutrophils Relative %: 45.7 %
Platelets: 219 10*3/uL (ref 140–400)
RBC: 4.06 10*6/uL (ref 3.80–5.10)
RDW: 12.8 % (ref 11.0–15.0)
Total Lymphocyte: 35 %
WBC: 2.6 10*3/uL — ABNORMAL LOW (ref 3.8–10.8)

## 2020-03-04 NOTE — Progress Notes (Signed)
I left message on the answering machine for patient to call back.  Her Chicas cell count is still low.  Please advise patient to space Enbrel every other week.  We will recheck CBC in 1 month.

## 2020-03-05 ENCOUNTER — Other Ambulatory Visit: Payer: Self-pay | Admitting: *Deleted

## 2020-03-05 DIAGNOSIS — Z79899 Other long term (current) drug therapy: Secondary | ICD-10-CM

## 2020-03-09 ENCOUNTER — Other Ambulatory Visit: Payer: Self-pay | Admitting: Rheumatology

## 2020-03-09 NOTE — Telephone Encounter (Signed)
I had a discussion with the patient regarding the Flexeril dosing.  She is willing to reduce Flexeril to 5 mg p.o. daily.  Please change prescription to Flexeril 5 mg tablet 1 tablet p.o. nightly as needed total 90 tablets with 0 refills.

## 2020-03-09 NOTE — Telephone Encounter (Signed)
Last Visit: 12/08/2019 Next Visit: 03/11/2020  Okay to refill Cyclobenzaprine?

## 2020-03-11 ENCOUNTER — Ambulatory Visit: Payer: Self-pay

## 2020-03-11 ENCOUNTER — Other Ambulatory Visit: Payer: Self-pay

## 2020-03-11 ENCOUNTER — Ambulatory Visit (INDEPENDENT_AMBULATORY_CARE_PROVIDER_SITE_OTHER): Payer: Medicare Other | Admitting: Rheumatology

## 2020-03-11 ENCOUNTER — Encounter: Payer: Self-pay | Admitting: Physician Assistant

## 2020-03-11 VITALS — BP 119/71 | HR 78 | Resp 14 | Ht 64.75 in | Wt 131.6 lb

## 2020-03-11 DIAGNOSIS — M0579 Rheumatoid arthritis with rheumatoid factor of multiple sites without organ or systems involvement: Secondary | ICD-10-CM | POA: Diagnosis not present

## 2020-03-11 DIAGNOSIS — M5030 Other cervical disc degeneration, unspecified cervical region: Secondary | ICD-10-CM

## 2020-03-11 DIAGNOSIS — M19042 Primary osteoarthritis, left hand: Secondary | ICD-10-CM | POA: Diagnosis not present

## 2020-03-11 DIAGNOSIS — M170 Bilateral primary osteoarthritis of knee: Secondary | ICD-10-CM

## 2020-03-11 DIAGNOSIS — Z8639 Personal history of other endocrine, nutritional and metabolic disease: Secondary | ICD-10-CM

## 2020-03-11 DIAGNOSIS — D709 Neutropenia, unspecified: Secondary | ICD-10-CM

## 2020-03-11 DIAGNOSIS — F5101 Primary insomnia: Secondary | ICD-10-CM

## 2020-03-11 DIAGNOSIS — Z8659 Personal history of other mental and behavioral disorders: Secondary | ICD-10-CM

## 2020-03-11 DIAGNOSIS — M19041 Primary osteoarthritis, right hand: Secondary | ICD-10-CM

## 2020-03-11 DIAGNOSIS — Z79899 Other long term (current) drug therapy: Secondary | ICD-10-CM | POA: Diagnosis not present

## 2020-03-11 DIAGNOSIS — Z8679 Personal history of other diseases of the circulatory system: Secondary | ICD-10-CM

## 2020-03-11 DIAGNOSIS — M51369 Other intervertebral disc degeneration, lumbar region without mention of lumbar back pain or lower extremity pain: Secondary | ICD-10-CM

## 2020-03-11 DIAGNOSIS — Z5181 Encounter for therapeutic drug level monitoring: Secondary | ICD-10-CM

## 2020-03-11 DIAGNOSIS — R21 Rash and other nonspecific skin eruption: Secondary | ICD-10-CM

## 2020-03-11 DIAGNOSIS — Z8719 Personal history of other diseases of the digestive system: Secondary | ICD-10-CM

## 2020-03-11 DIAGNOSIS — L568 Other specified acute skin changes due to ultraviolet radiation: Secondary | ICD-10-CM

## 2020-03-11 DIAGNOSIS — M797 Fibromyalgia: Secondary | ICD-10-CM

## 2020-03-11 DIAGNOSIS — M5136 Other intervertebral disc degeneration, lumbar region: Secondary | ICD-10-CM

## 2020-03-11 DIAGNOSIS — I7300 Raynaud's syndrome without gangrene: Secondary | ICD-10-CM

## 2020-03-12 NOTE — Progress Notes (Signed)
WBC count is still low.  She should space Enbrel to every other week as discussed during the last office visit.  Please repeat CBC in 1 month.  Rest the labs are pending.

## 2020-03-16 LAB — LUPUS ANTICOAGULANT EVAL W/ REFLEX
PTT-LA Screen: 34 s (ref <=40)
dRVVT: 42 s (ref <=45)

## 2020-03-16 LAB — CARDIOLIPIN ANTIBODIES, IGG, IGM, IGA
Anticardiolipin IgA: <2 APL-U/mL
Anticardiolipin IgG: <2 GPL-U/mL
Anticardiolipin IgM: <2 MPL-U/mL

## 2020-03-16 LAB — SJOGRENS SYNDROME-A EXTRACTABLE NUCLEAR ANTIBODY: SSA (Ro) (ENA) Antibody, IgG: >8 AI — AB

## 2020-03-16 LAB — CBC WITH DIFFERENTIAL/PLATELET
Absolute Monocytes: 369 cells/uL (ref 200–950)
Basophils Absolute: 29 cells/uL (ref 0–200)
Basophils Relative: 1.1 %
Eosinophils Absolute: 60 cells/uL (ref 15–500)
Eosinophils Relative: 2.3 %
HCT: 35.9 % (ref 35.0–45.0)
Hemoglobin: 11.9 g/dL (ref 11.7–15.5)
Lymphs Abs: 996 cells/uL (ref 850–3900)
MCH: 29.9 pg (ref 27.0–33.0)
MCHC: 33.1 g/dL (ref 32.0–36.0)
MCV: 90.2 fL (ref 80.0–100.0)
MPV: 11.2 fL (ref 7.5–12.5)
Monocytes Relative: 14.2 %
Neutro Abs: 1147 cells/uL — ABNORMAL LOW (ref 1500–7800)
Neutrophils Relative %: 44.1 %
Platelets: 204 10*3/uL (ref 140–400)
RBC: 3.98 10*6/uL (ref 3.80–5.10)
RDW: 13.1 % (ref 11.0–15.0)
Total Lymphocyte: 38.3 %
WBC: 2.6 10*3/uL — ABNORMAL LOW (ref 3.8–10.8)

## 2020-03-16 LAB — ANTI-NUCLEAR AB-TITER (ANA TITER)
ANA TITER: 1:160 {titer} — ABNORMAL HIGH
ANA Titer 1: 1:80 {titer} — ABNORMAL HIGH

## 2020-03-16 LAB — ANTI-SCLERODERMA ANTIBODY: Scleroderma (Scl-70) (ENA) Antibody, IgG: <1 AI

## 2020-03-16 LAB — ANA: Anti Nuclear Antibody (ANA): POSITIVE — AB

## 2020-03-16 LAB — SJOGRENS SYNDROME-B EXTRACTABLE NUCLEAR ANTIBODY: SSB (La) (ENA) Antibody, IgG: <1 AI

## 2020-03-16 LAB — BETA-2 GLYCOPROTEIN ANTIBODIES
Beta-2 Glyco 1 IgA: <2 U/mL
Beta-2 Glyco 1 IgM: <2 U/mL
Beta-2 Glyco I IgG: <2 U/mL

## 2020-03-16 LAB — SEDIMENTATION RATE: Sed Rate: 14 mm/h (ref 0–30)

## 2020-03-16 LAB — ANTI-DNA ANTIBODY, DOUBLE-STRANDED: ds DNA Ab: <1 IU/mL

## 2020-03-16 LAB — C3 AND C4
C3 Complement: 99 mg/dL (ref 83–193)
C4 Complement: 22 mg/dL (ref 15–57)

## 2020-03-16 LAB — RNP ANTIBODY: Ribonucleic Protein(ENA) Antibody, IgG: <1 AI

## 2020-03-16 LAB — ANTI-SMITH ANTIBODY: ENA SM Ab Ser-aCnc: <1 AI

## 2020-03-16 NOTE — Progress Notes (Signed)
ANA and Ro antibody remain positive.  La negative.  DsDNA negative.  Complements and ESR WNL.  RNP, smith, scl-70 negative. Beta-2, anticardiolipin ab, and lupus anticoagulant negative.  Please ask the patient if she has been evaluated by her dermatologist yet.  Please forward labs to derm and ask for them to forward [place] records/biopsy results once complete.

## 2020-03-16 NOTE — Progress Notes (Signed)
See above message

## 2020-03-18 ENCOUNTER — Other Ambulatory Visit: Payer: Self-pay | Admitting: Rheumatology

## 2020-03-18 DIAGNOSIS — M0579 Rheumatoid arthritis with rheumatoid factor of multiple sites without organ or systems involvement: Secondary | ICD-10-CM

## 2020-03-18 MED ORDER — ENBREL MINI 50 MG/ML ~~LOC~~ SOCT: 50.0000 mg | SUBCUTANEOUS | 0 refills | Status: DC

## 2020-03-18 NOTE — Telephone Encounter (Signed)
Please schedule patient for a follow up visit. Patient due August 2021. Thanks! 

## 2020-03-18 NOTE — Telephone Encounter (Signed)
Please remind the patient to update her lab work this month due monitor WBC count. Ok to refill enbrel.

## 2020-03-18 NOTE — Telephone Encounter (Signed)
Patient has an appointment scheduled for 04/13/2020 and will update at that appointment.

## 2020-03-18 NOTE — Telephone Encounter (Signed)
Last Visit: 03/11/2020 Next Visit: due August 2021. Message sent to the front to schedule patient  Labs: 01/19/2020 CMP is stable. WBC count has improved but not normal. Patient may try to space Enbrel every 2 weeks. Repeat CBC in 1 month. 03/01/2020 Her Caudillo cell count is still low. TB Gold: 10/21/2019    DX: Rheumatoid arthritis with rheumatoid factor of multiple sites without organ or systems involvement   Okay to refill Enbrel?

## 2020-04-01 NOTE — Progress Notes (Signed)
Office Visit Note  Patient: Mary Lane             Date of Birth: [birth year]           MRN: [id number]             PCP: Sinclair Ship, MD Referring: Sinclair Ship, MD Visit Date: 04/13/2020 Occupation: _0 @  Subjective:  Medication monitoring.   History of Present Illness: Mary Lane is a 59 y.o. female history of rheumatoid arthritis, osteoarthritis, Sjogren's, fibromyalgia syndrome and degenerative disc disease.  She was seen last at the end of July when she developed a rash over her extremities.  She was evaluated by her dermatologist locally in [place].  No biopsy was performed.  She was given Kenalog injection and clobetasol cream.  The rash cleared up in 2 days.  She was told that it was most likely contact dermatitis.  She has had no recurrence of rash since then.  She has been trying to avoid sun.  She continues to have some discomfort in her joints especially her hands or feet.  She has been also having some stiffness in her neck.  She continues to have dry mouth and dry eyes.  Raynaud's is a still bothersome.  She has been taking tramadol 1 to 2 tablets p.o.twice daily secondary to pain.  She describes her pain level on the scale of 0-10 about 8 or 9 without tramadol.  Admit tramadol the pain level comes down to 5 and 6.  Activities of Daily Living:  Patient reports morning stiffness for 30 minutes.   Patient Reports nocturnal pain.  Difficulty dressing/grooming: Denies Difficulty climbing stairs: Denies Difficulty getting out of chair: Denies Difficulty using hands for taps, buttons, cutlery, and/or writing: Denies  Review of Systems  Constitutional: Positive for fatigue. Negative for night sweats, weight gain and weight loss.  HENT: Positive for mouth dryness. Negative for mouth sores, trouble swallowing, trouble swallowing and nose dryness.   Eyes: Positive for dryness. Negative for pain, redness and visual disturbance.  Respiratory: Negative for cough, shortness  of breath and difficulty breathing.   Cardiovascular: Negative for chest pain, palpitations, hypertension, irregular heartbeat and swelling in legs/feet.  Gastrointestinal: Negative for blood in stool, constipation and diarrhea.  Endocrine: Negative for increased urination.  Genitourinary: Negative for vaginal dryness.  Musculoskeletal: Positive for arthralgias, joint pain and morning stiffness. Negative for joint swelling, myalgias, muscle weakness, muscle tenderness and myalgias.  Skin: Positive for color change. Negative for rash, hair loss, skin tightness, ulcers and sensitivity to sunlight.  Allergic/Immunologic: Negative for susceptible to infections.  Neurological: Negative for dizziness, memory loss, night sweats and weakness.  Hematological: Negative for swollen glands.  Psychiatric/Behavioral: Positive for sleep disturbance. Negative for depressed mood. The patient is not nervous/anxious.     PMFS History:  Patient Active Problem List   Diagnosis Date Noted  . Rheumatoid arthritis with rheumatoid factor of multiple sites without organ or systems involvement ([place]) 09/25/2017  . Raynaud's syndrome without gangrene 09/25/2017  . Medication monitoring encounter 09/25/2017  . Primary osteoarthritis of both hands 09/25/2017  . Primary osteoarthritis of both knees 09/25/2017  . DDD (degenerative disc disease), lumbar 09/25/2017  . Primary insomnia 09/25/2017  . Fibromyalgia 09/25/2017  . Mouth sores 09/25/2017  . Essential hypertension 09/25/2017  . History of gastroesophageal reflux (GERD) 09/25/2017  . History of depression 09/25/2017  . Vitamin D deficiency 09/25/2017  . History of hypercholesterolemia 09/25/2017  . High risk medication use 09/06/2016    Past  Medical History:  Diagnosis Date  . Allergies   . Chronic GERD   . DDD (degenerative disc disease), cervical   . DDD (degenerative disc disease), lumbar   . DDD (degenerative disc disease), thoracic   . Depression    . Fibromyalgia   . High cholesterol   . Hypertension   . Klippel-Feil syndrome   . Low sodium levels   . Osteoarthritis   . Raynaud disease   . Rheumatoid arthritis ([place])   . Shoulder pain, right   . Sjogren's disease ([place])   . TMJ disease     Family History  Problem Relation Age of Onset  . Hypertension Mother   . Prostate cancer Father   . Hypertension Father   . Hypertension Sister   . Hypercholesterolemia Sister   . Bipolar disorder Sister   . Depression Sister   . Healthy Daughter   . Healthy Son    Past Surgical History:  Procedure Laterality Date  . ABDOMINAL HYSTERECTOMY    . APPENDECTOMY    . BACK SURGERY    . CARPAL TUNNEL RELEASE Bilateral   . CHOLECYSTECTOMY    . ERCP    . KNEE SURGERY Bilateral   . TUBAL LIGATION     Social History   Social History Narrative  . Not on file   Immunization History  Administered Date(s) Administered  . Zoster 09/02/2013     Objective: Vital Signs: BP 139/79 (BP Location: Left Arm, Patient Position: Sitting, Cuff Size: Small)   Pulse 76   Resp 14   Ht 5' 4.75" (1.645 m)   Wt 127 lb 6.4 oz (57.8 kg)   BMI 21.36 kg/m    Physical Exam Vitals and nursing note reviewed.  Constitutional:      Appearance: She is well-developed.  HENT:     Head: Normocephalic and atraumatic.  Eyes:     Conjunctiva/sclera: Conjunctivae normal.  Cardiovascular:     Rate and Rhythm: Normal rate and regular rhythm.     Heart sounds: Normal heart sounds.  Pulmonary:     Effort: Pulmonary effort is normal.     Breath sounds: Normal breath sounds.  Abdominal:     General: Bowel sounds are normal.     Palpations: Abdomen is soft.  Musculoskeletal:     Cervical back: Normal range of motion.  Lymphadenopathy:     Cervical: No cervical adenopathy.  Skin:    General: Skin is warm and dry.     Capillary Refill: Capillary refill takes less than 2 seconds.  Neurological:     Mental Status: She is alert and oriented to person, place,  and time.  Psychiatric:        Behavior: Behavior normal.      Musculoskeletal Exam: She has some limitation with range of motion of cervical lumbar spine.  Shoulder joints, elbow joints, wrist joints with good range of motion.  She had no MCP synovitis or synovial thickening.  DIP and PIP thickening was noted.  Hip joints, knee joints, ankles with good range of motion.  She has some tenderness across PIPs and DIPs but no synovitis was noted.  CDAI Exam: CDAI Score: 0.6  Patient Global: 3 mm; Provider Global: 3 mm Swollen: 0 ; Tender: 0  Joint Exam 04/13/2020   No joint exam has been documented for this visit   There is currently no information documented on the homunculus. Go to the Rheumatology activity and complete the homunculus joint exam.  Investigation: No additional findings.  Imaging: No results found.  Recent Labs: Lab Results  Component Value Date   WBC 4.4 04/02/2020   HGB 11.6 (L) 04/02/2020   PLT 235 04/02/2020   NA 135 01/19/2020   K 3.8 01/19/2020   CL 97 (L) 01/19/2020   CO2 32 01/19/2020   GLUCOSE 75 01/19/2020   BUN 12 01/19/2020   CREATININE 0.64 01/19/2020   BILITOT 0.4 01/19/2020   ALKPHOS 52 08/28/2016   AST 20 01/19/2020   ALT 15 01/19/2020   PROT 6.9 01/19/2020   ALBUMIN 4.6 08/28/2016   CALCIUM 9.8 01/19/2020   GFRAA 112 01/19/2020   QFTBGOLDPLUS NEGATIVE 10/21/2019   03/11/2020 ANA 1: 160 speckled, SSA positive, (SSB negative, SCL 70, Smith, RNP, dsDNA negative, C3-C4 negative), lupus anticoagulant negative, beta-2 negative, anticardiolipin negative, ESR 14  Speciality Comments: Patient may try to space Enbrel every 2 weeks  Procedures:  No procedures performed Allergies: Baclofen, Gabapentin, Ivp dye [iodinated diagnostic agents], and Sulfa antibiotics   Assessment / Plan:     Visit Diagnoses: Rheumatoid arthritis with rheumatoid factor of multiple sites without organ or systems involvement ([place]) - +RF, +ANA: Her dose of Enbrel was  reduced to Enbrel 50 mg subcu every other week due to neutropenia.  She has been tolerating it well.  She had no synovitis on my examination today.  Although she received Kenalog from the dermatologist.  Sjogren's syndrome with other organ involvement [employer]) -she has been experiencing severe sicca symptoms with dry mouth and dry eyes.  Labs obtained recently showed ANA 1: 160 speckled, SSA positive.  Detailed counseling guarding Sjogren's was provided.  Due to neutropenia I cannot add more medications at this time.  I discussed the possible use of pilocarpine.  She has been using over-the-counter products.  There is no history of asthma or increased intraocular pressure.  Will call in prescription for pilocarpine 5 mg p.o. 3 times daily as needed.  90-day supply with refills will be given.  Rash-she recently developed a rash which was seen by her dermatologist and was diagnosed with contact dermatitis.  She did not get a skin biopsy.  My concern was if she had subcutaneous lupus or a rash related to Sjogren's.  I have advised her to get a skin biopsy if she develops a rash again.  She was treated with Kenalog and clobetasol ointment.  Use of sunscreen was emphasized.  Raynaud's syndrome without gangrene-she has mild Raynaud's symptoms which are manageable.  High risk medication use - Enbrel 50 mg subcutaneous injections every 14 days.  We will check CBC and CMP in 87monthand then every 2 months until labs are stable.- Plan: DRUG MONITOR, TRAMADOL,QN, URINE, DRUG MONITOR, PANEL 5, W/CONF, URINE today.  Medication monitoring encounter -she is on tramadol 50 mg 2 tablets p.o. twice daily.  She states despite of that her pain is not very well controlled.  We will check. UDS & narcotic agreement: 10/21/2019.  I will refer her to pain management for better control of her pain.  Primary osteoarthritis of both hands-joint protection muscle strengthening was discussed.  Primary osteoarthritis of both knees-no  warmth swelling or effusion was noted.  She continues to have some discomfort in her knee joints.  Muscle strength exercises were discussed.  Fibromyalgia -she is on cymbalta 20 mg p.o. daily, Flexeril 5 mg p.o. nightly and methocarbamol.  Despite of all these medication she is not having enough control of her pain.  DDD (degenerative disc disease), cervical-she has some limitation with range  of motion and stiffness.  DDD (degenerative disc disease), lumbar-she continues to have chronic lower back pain.  Core strengthening was discussed.  Neutropenia, unspecified type (HCC)-most likely due to Enbrel.  We will space the Enbrel dose.  We will see response to that.  She recently had cortisone injection which may have interfered with her most recent labs.  We will repeat labs in a month.  Primary insomnia-she reports having insomnia due to chronic pain.  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    History of depression  History of hypercholesterolemia  History of hypertension  History of vitamin D deficiency  History of gastroesophageal reflux (GERD)  Osteoporosis screening-patient states she get bone density through her PCP.  Orders: Orders Placed This Encounter  Procedures  . DRUG MONITOR, TRAMADOL,QN, URINE  . DRUG MONITOR, PANEL 5, W/CONF, URINE  . Ambulatory referral to Pain Clinic   Meds ordered this encounter  Medications  . pilocarpine (SALAGEN) 5 MG tablet    Sig: Take 1 tablet (5 mg total) by mouth 3 (three) times daily as needed.    Dispense:  90 tablet    Refill:  1     Follow-Up Instructions: Return in about 5 months (around 09/13/2020) for Rheumatoid arthritis, Osteoarthritis,FMS, Sjogren's.   Bo Merino, MD  Note - This record has been created using Editor, commissioning.  Chart creation errors have been sought, but may not always  have been located. Such creation errors do not reflect on  the standard of medical care.

## 2020-04-02 ENCOUNTER — Other Ambulatory Visit: Payer: Self-pay | Admitting: *Deleted

## 2020-04-02 DIAGNOSIS — Z79899 Other long term (current) drug therapy: Secondary | ICD-10-CM

## 2020-04-02 LAB — CBC WITH DIFFERENTIAL/PLATELET
Absolute Monocytes: 559 cells/uL (ref 200–950)
Basophils Absolute: 22 cells/uL (ref 0–200)
Basophils Relative: 0.5 %
Eosinophils Absolute: 48 cells/uL (ref 15–500)
Eosinophils Relative: 1.1 %
HCT: 34.4 % — ABNORMAL LOW (ref 35.0–45.0)
Hemoglobin: 11.6 g/dL — ABNORMAL LOW (ref 11.7–15.5)
Lymphs Abs: 1505 cells/uL (ref 850–3900)
MCH: 30.1 pg (ref 27.0–33.0)
MCHC: 33.7 g/dL (ref 32.0–36.0)
MCV: 89.4 fL (ref 80.0–100.0)
MPV: 10.6 fL (ref 7.5–12.5)
Monocytes Relative: 12.7 %
Neutro Abs: 2266 cells/uL (ref 1500–7800)
Neutrophils Relative %: 51.5 %
Platelets: 235 10*3/uL (ref 140–400)
RBC: 3.85 10*6/uL (ref 3.80–5.10)
RDW: 13.6 % (ref 11.0–15.0)
Total Lymphocyte: 34.2 %
WBC: 4.4 10*3/uL (ref 3.8–10.8)

## 2020-04-05 NOTE — Progress Notes (Signed)
WBC count is better now.  She should continue Enbrel every other week.

## 2020-04-08 ENCOUNTER — Other Ambulatory Visit: Payer: Self-pay | Admitting: Rheumatology

## 2020-04-08 NOTE — Telephone Encounter (Signed)
Last Visit: 03/11/2020 Next Visit: 04/13/2020  Okay to refill Robaxin?

## 2020-04-08 NOTE — Telephone Encounter (Signed)
We will no longer be able to refill 90 day supplies of tramadol.  We can only send in a 30 day supply.  She is due to update UDS and narcotic agreement in September 2021.   Please discuss a referral to pain management since she is on a high dose of tramadol.

## 2020-04-08 NOTE — Telephone Encounter (Signed)
Last Visit:03/11/2020 Next Visit: 04/13/2020 UDS: 10/21/2019 Narc Agreement: 10/21/2019  Last Fill: 01/02/2020  Okay to refill Tramadol?

## 2020-04-08 NOTE — Telephone Encounter (Signed)
Patient advised we will no longer be able to refill 90 day supplies of tramadol.  We can only send in a 30 day supply.  She is due to update UDS and narcotic agreement in September 2021.  Patient has an appointment on 04/13/2020. Patient will update at that appointment.  Discussed referral to pain management since she is on a high dose of tramadol. Patient states she will think about it and let us know at her appointment if she wants referral.

## 2020-04-08 NOTE — Telephone Encounter (Signed)
Attempted to contact the patient and left message for patient to call the office.  

## 2020-04-13 ENCOUNTER — Encounter: Payer: Self-pay | Admitting: Rheumatology

## 2020-04-13 ENCOUNTER — Ambulatory Visit (INDEPENDENT_AMBULATORY_CARE_PROVIDER_SITE_OTHER): Payer: Medicare Other | Admitting: Rheumatology

## 2020-04-13 ENCOUNTER — Other Ambulatory Visit: Payer: Self-pay

## 2020-04-13 VITALS — BP 139/79 | HR 76 | Resp 14 | Ht 64.75 in | Wt 127.4 lb

## 2020-04-13 DIAGNOSIS — I7300 Raynaud's syndrome without gangrene: Secondary | ICD-10-CM | POA: Diagnosis not present

## 2020-04-13 DIAGNOSIS — Z5181 Encounter for therapeutic drug level monitoring: Secondary | ICD-10-CM

## 2020-04-13 DIAGNOSIS — Z8639 Personal history of other endocrine, nutritional and metabolic disease: Secondary | ICD-10-CM

## 2020-04-13 DIAGNOSIS — R21 Rash and other nonspecific skin eruption: Secondary | ICD-10-CM

## 2020-04-13 DIAGNOSIS — Z79899 Other long term (current) drug therapy: Secondary | ICD-10-CM

## 2020-04-13 DIAGNOSIS — M3509 Sicca syndrome with other organ involvement: Secondary | ICD-10-CM

## 2020-04-13 DIAGNOSIS — M0579 Rheumatoid arthritis with rheumatoid factor of multiple sites without organ or systems involvement: Secondary | ICD-10-CM

## 2020-04-13 DIAGNOSIS — Z8719 Personal history of other diseases of the digestive system: Secondary | ICD-10-CM

## 2020-04-13 DIAGNOSIS — M51369 Other intervertebral disc degeneration, lumbar region without mention of lumbar back pain or lower extremity pain: Secondary | ICD-10-CM

## 2020-04-13 DIAGNOSIS — Z8679 Personal history of other diseases of the circulatory system: Secondary | ICD-10-CM

## 2020-04-13 DIAGNOSIS — D709 Neutropenia, unspecified: Secondary | ICD-10-CM

## 2020-04-13 DIAGNOSIS — Z8659 Personal history of other mental and behavioral disorders: Secondary | ICD-10-CM

## 2020-04-13 DIAGNOSIS — M5030 Other cervical disc degeneration, unspecified cervical region: Secondary | ICD-10-CM

## 2020-04-13 DIAGNOSIS — M19041 Primary osteoarthritis, right hand: Secondary | ICD-10-CM

## 2020-04-13 DIAGNOSIS — M19042 Primary osteoarthritis, left hand: Secondary | ICD-10-CM

## 2020-04-13 DIAGNOSIS — M170 Bilateral primary osteoarthritis of knee: Secondary | ICD-10-CM

## 2020-04-13 DIAGNOSIS — M5136 Other intervertebral disc degeneration, lumbar region: Secondary | ICD-10-CM

## 2020-04-13 DIAGNOSIS — M797 Fibromyalgia: Secondary | ICD-10-CM

## 2020-04-13 DIAGNOSIS — L568 Other specified acute skin changes due to ultraviolet radiation: Secondary | ICD-10-CM

## 2020-04-13 DIAGNOSIS — Z13820 Encounter for screening for osteoporosis: Secondary | ICD-10-CM

## 2020-04-13 DIAGNOSIS — F5101 Primary insomnia: Secondary | ICD-10-CM

## 2020-04-13 MED ORDER — PILOCARPINE HCL 5 MG PO TABS: 5.0000 mg | ORAL_TABLET | Freq: Three times a day (TID) | ORAL | 1 refills | Status: DC | PRN

## 2020-04-13 NOTE — Patient Instructions (Addendum)
Standing Labs We placed an order today for your standing lab work.   Please have your standing labs drawn in September and then every 2 months  If possible, please have your labs drawn 2 weeks prior to your appointment so that the provider can discuss your results at your appointment.  We have open lab daily Monday through Thursday from 8:30-12:30 PM and 1:30-4:30 PM and Friday from 8:30-12:30 PM and 1:30-4:00 PM at the office of Dr. Pollyann Savoy, [facility].   Please be advised, patients with office appointments requiring lab work will take precedents over walk-in lab work.  If possible, please come for your lab work on Monday and Friday afternoons, as you may experience shorter wait times. The office is located at [address], [place], [place] [address] No appointment is necessary.   Labs are drawn by [facility]. Please bring your co-pay at the time of your lab draw.  You may receive a bill from [facility] for your lab work.  If you wish to have your labs drawn at another location, please call the office 24 hours in advance to send orders.  If you have any questions regarding directions or hours of operation,  please call [phone].   As a reminder, please drink plenty of water prior to coming for your lab work. Thanks!   COVID-19 vaccine recommendations:   COVID-19 vaccine is recommended for everyone (unless you are allergic to a vaccine component), even if you are on a medication that suppresses your immune system.   If you are on Methotrexate, Cellcept (mycophenolate), Rinvoq, Harriette Ohara, and Olumiant- hold the medication for 1 week after each vaccine. Hold Methotrexate for 2 weeks after the single dose COVID-19 vaccine.   If you are on Orencia subcutaneous injection - hold medication one week prior to and one week after the first COVID-19 vaccine dose (only).   If you are on Orencia IV infusions- time vaccination administration so that the first COVID-19  vaccination will occur four weeks after the infusion and postpone the subsequent infusion by one week.   If you are on Cyclophosphamide or Rituxan infusions please contact your doctor prior to receiving the COVID-19 vaccine.   Do not take Tylenol or any anti-inflammatory medications (NSAIDs) 24 hours prior to the COVID-19 vaccination.   There is no direct evidence about the efficacy of the COVID-19 vaccine in individuals who are on medications that suppress the immune system.   Even if you are fully vaccinated, and you are on any medications that suppress your immune system, please continue to wear a mask, maintain at least six feet social distance and practice hand hygiene.   If you develop a COVID-19 infection, please contact your PCP or our office to determine if you need antibody infusion.  The booster vaccine is now available for immunocompromised patients. It is advised that if you had Pfizer vaccine you should get [employer] booster.  If you had a Moderna vaccine then you should get a Moderna booster. Johnson and Laural Benes does not have a booster vaccine at this time.  Please see the following web sites for updated information.   https://www.[email or web address]/[email or web address]/0/Files/COVID-19-Vaccination-Patient-Resources.pdf  https://www.[email or web address].org/About-Us/Newsroom/Press-Releases/ID/1159

## 2020-04-14 LAB — DRUG MONITOR, PANEL 5, W/CONF, URINE
Amphetamines: NEGATIVE ng/mL (ref <500)
Barbiturates: NEGATIVE ng/mL (ref <300)
Benzodiazepines: NEGATIVE ng/mL (ref <100)
Cocaine Metabolite: NEGATIVE ng/mL (ref <150)
Creatinine: 58.4 mg/dL
Marijuana Metabolite: NEGATIVE ng/mL (ref <20)
Methadone Metabolite: NEGATIVE ng/mL (ref <100)
Opiates: NEGATIVE ng/mL (ref <100)
Oxidant: NEGATIVE ug/mL
Oxycodone: NEGATIVE ng/mL (ref <100)
pH: 7.2 (ref 4.5–9.0)

## 2020-04-14 LAB — DRUG MONITOR, TRAMADOL,QN, URINE
Desmethyltramadol: 3234 ng/mL — ABNORMAL HIGH (ref <100)
Tramadol: >10000 ng/mL — ABNORMAL HIGH (ref <100)

## 2020-04-14 LAB — DM TEMPLATE

## 2020-04-15 NOTE — Progress Notes (Signed)
UDS is consistent with tramadol use.

## 2020-05-10 ENCOUNTER — Other Ambulatory Visit: Payer: Self-pay

## 2020-05-10 DIAGNOSIS — Z79899 Other long term (current) drug therapy: Secondary | ICD-10-CM

## 2020-05-10 LAB — COMPLETE METABOLIC PANEL WITH GFR
AG Ratio: 1.9 (calc) (ref 1.0–2.5)
ALT: 17 U/L (ref 6–29)
AST: 23 U/L (ref 10–35)
Albumin: 4.6 g/dL (ref 3.6–5.1)
Alkaline phosphatase (APISO): 38 U/L (ref 37–153)
BUN: 7 mg/dL (ref 7–25)
CO2: 30 mmol/L (ref 20–32)
Calcium: 9.7 mg/dL (ref 8.6–10.4)
Chloride: 93 mmol/L — ABNORMAL LOW (ref 98–110)
Creat: 0.59 mg/dL (ref 0.50–0.99)
GFR, Est African American: 115 mL/min/{1.73_m2} (ref >=60)
GFR, Est Non African American: 99 mL/min/{1.73_m2} (ref >=60)
Globulin: 2.4 g/dL (calc) (ref 1.9–3.7)
Glucose, Bld: 93 mg/dL (ref 65–99)
Potassium: 4.4 mmol/L (ref 3.5–5.3)
Sodium: 131 mmol/L — ABNORMAL LOW (ref 135–146)
Total Bilirubin: 0.3 mg/dL (ref 0.2–1.2)
Total Protein: 7 g/dL (ref 6.1–8.1)

## 2020-05-10 LAB — CBC WITH DIFFERENTIAL/PLATELET
Absolute Monocytes: 353 cells/uL (ref 200–950)
Basophils Absolute: 30 cells/uL (ref 0–200)
Basophils Relative: 0.9 %
Eosinophils Absolute: 83 cells/uL (ref 15–500)
Eosinophils Relative: 2.5 %
HCT: 35.4 % (ref 35.0–45.0)
Hemoglobin: 11.9 g/dL (ref 11.7–15.5)
Lymphs Abs: 1478 cells/uL (ref 850–3900)
MCH: 30.6 pg (ref 27.0–33.0)
MCHC: 33.6 g/dL (ref 32.0–36.0)
MCV: 91 fL (ref 80.0–100.0)
MPV: 10.6 fL (ref 7.5–12.5)
Monocytes Relative: 10.7 %
Neutro Abs: 1356 cells/uL — ABNORMAL LOW (ref 1500–7800)
Neutrophils Relative %: 41.1 %
Platelets: 245 10*3/uL (ref 140–400)
RBC: 3.89 10*6/uL (ref 3.80–5.10)
RDW: 14 % (ref 11.0–15.0)
Total Lymphocyte: 44.8 %
WBC: 3.3 10*3/uL — ABNORMAL LOW (ref 3.8–10.8)

## 2020-05-11 NOTE — Progress Notes (Signed)
Labs are stable.  WBC count is low, sodium is low.  We will continue to monitor labs every 3 months.  Please forward labs to her PCP.

## 2020-05-19 ENCOUNTER — Other Ambulatory Visit: Payer: Self-pay | Admitting: Physician Assistant

## 2020-05-19 ENCOUNTER — Other Ambulatory Visit: Payer: Self-pay | Admitting: Rheumatology

## 2020-05-19 DIAGNOSIS — M0579 Rheumatoid arthritis with rheumatoid factor of multiple sites without organ or systems involvement: Secondary | ICD-10-CM

## 2020-05-20 NOTE — Telephone Encounter (Signed)
Last Visit: 04/13/2020 Next Visit: 07/16/2020  Okay to refill Cyclobenzaprine?

## 2020-05-20 NOTE — Telephone Encounter (Signed)
It is too soon to refill flexeril.  Dr. Corliss Skains sent in 90 tablets on 03/09/20.

## 2020-05-20 NOTE — Telephone Encounter (Signed)
Last Visit: 04/13/2020 Next Visit: 07/16/2020 Labs: 05/10/2020 Labs are stable. WBC count is low, sodium is low.  TB Gold: 10/21/2019 Neg   UDS: 04/13/2020 Narc Agreement: 04/13/2020 Last Fill: 04/08/2020   Current Dose per office note on 04/13/2020: Enbrel 50 mg subcutaneous injections every 14 days. tramadol 50 mg 2 tablets p.o. twice daily  Okay to refill Enbrel and Tramadol?

## 2020-06-21 ENCOUNTER — Other Ambulatory Visit: Payer: Self-pay | Admitting: Physician Assistant

## 2020-06-22 NOTE — Telephone Encounter (Signed)
Last Visit: 04/13/2020 Next Visit: 07/16/2020 UDS: 04/13/2020 Narc Agreement: 04/13/2020  Last Fill: 05/20/2020  Current Dose per office note on 04/13/2020:  tramadol50 mg 2 tablets p.o. twice daily  Okay to refill Tramadol?

## 2020-07-02 NOTE — Progress Notes (Deleted)
Office Visit Note  Patient: Mary Lane             Date of Birth: 17-Mar-1961           MRN: [id number]             PCP: Lynn Ito, MD Referring: Lynn Ito, MD Visit Date: 07/16/2020 Occupation: @GUAROCC @  Subjective:  No chief complaint on file.   History of Present Illness: Mary Lane is a 59 y.o. female ***   Activities of Daily Living:  Patient reports morning stiffness for *** {minute/hour:19697}.   Patient {ACTIONS;DENIES/REPORTS:21021675::"Denies"} nocturnal pain.  Difficulty dressing/grooming: {ACTIONS;DENIES/REPORTS:21021675::"Denies"} Difficulty climbing stairs: {ACTIONS;DENIES/REPORTS:21021675::"Denies"} Difficulty getting out of chair: {ACTIONS;DENIES/REPORTS:21021675::"Denies"} Difficulty using hands for taps, buttons, cutlery, and/or writing: {ACTIONS;DENIES/REPORTS:21021675::"Denies"}  No Rheumatology ROS completed.   PMFS History:  Patient Active Problem List   Diagnosis Date Noted  . Rheumatoid arthritis with rheumatoid factor of multiple sites without organ or systems involvement (HCC) 09/25/2017  . Raynaud's syndrome without gangrene 09/25/2017  . Medication monitoring encounter 09/25/2017  . Primary osteoarthritis of both hands 09/25/2017  . Primary osteoarthritis of both knees 09/25/2017  . DDD (degenerative disc disease), lumbar 09/25/2017  . Primary insomnia 09/25/2017  . Fibromyalgia 09/25/2017  . Mouth sores 09/25/2017  . Essential hypertension 09/25/2017  . History of gastroesophageal reflux (GERD) 09/25/2017  . History of depression 09/25/2017  . Vitamin D deficiency 09/25/2017  . History of hypercholesterolemia 09/25/2017  . High risk medication use 09/06/2016    Past Medical History:  Diagnosis Date  . Allergies   . Chronic GERD   . DDD (degenerative disc disease), cervical   . DDD (degenerative disc disease), lumbar   . DDD (degenerative disc disease), thoracic   . Depression   . Fibromyalgia   . High cholesterol   .  Hypertension   . Klippel-Feil syndrome   . Low sodium levels   . Osteoarthritis   . Raynaud disease   . Rheumatoid arthritis (HCC)   . Shoulder pain, right   . Sjogren's disease (HCC)   . TMJ disease     Family History  Problem Relation Age of Onset  . Hypertension Mother   . Prostate cancer Father   . Hypertension Father   . Hypertension Sister   . Hypercholesterolemia Sister   . Bipolar disorder Sister   . Depression Sister   . Healthy Daughter   . Healthy Son    Past Surgical History:  Procedure Laterality Date  . ABDOMINAL HYSTERECTOMY    . APPENDECTOMY    . BACK SURGERY    . CARPAL TUNNEL RELEASE Bilateral   . CHOLECYSTECTOMY    . ERCP    . KNEE SURGERY Bilateral   . TUBAL LIGATION     Social History   Social History Narrative  . Not on file   Immunization History  Administered Date(s) Administered  . Zoster 09/02/2013     Objective: Vital Signs: There were no vitals taken for this visit.   Physical Exam   Musculoskeletal Exam: ***  CDAI Exam: CDAI Score: -- Patient Global: --; Provider Global: -- Swollen: --; Tender: -- Joint Exam 07/16/2020   No joint exam has been documented for this visit   There is currently no information documented on the homunculus. Go to the Rheumatology activity and complete the homunculus joint exam.  Investigation: No additional findings.  Imaging: No results found.  Recent Labs: Lab Results  Component Value Date   WBC 3.3 (L) 05/10/2020   HGB 11.9 05/10/2020  PLT 245 05/10/2020   NA 131 (L) 05/10/2020   K 4.4 05/10/2020   CL 93 (L) 05/10/2020   CO2 30 05/10/2020   GLUCOSE 93 05/10/2020   BUN 7 05/10/2020   CREATININE 0.59 05/10/2020   BILITOT 0.3 05/10/2020   ALKPHOS 52 08/28/2016   AST 23 05/10/2020   ALT 17 05/10/2020   PROT 7.0 05/10/2020   ALBUMIN 4.6 08/28/2016   CALCIUM 9.7 05/10/2020   GFRAA 115 05/10/2020   QFTBGOLDPLUS NEGATIVE 10/21/2019    Speciality Comments: Patient may try to  space Enbrel every 2 weeks  Procedures:  No procedures performed Allergies: Baclofen, Gabapentin, Ivp dye [iodinated diagnostic agents], and Sulfa antibiotics   Assessment / Plan:     Visit Diagnoses: No diagnosis found.  Orders: No orders of the defined types were placed in this encounter.  No orders of the defined types were placed in this encounter.   Face-to-face time spent with patient was *** minutes. Greater than 50% of time was spent in counseling and coordination of care.  Follow-Up Instructions: No follow-ups on file.   Ellen Henri, CMA  Note - This record has been created using Animal nutritionist.  Chart creation errors have been sought, but may not always  have been located. Such creation errors do not reflect on  the standard of medical care.

## 2020-07-05 ENCOUNTER — Other Ambulatory Visit: Payer: Self-pay | Admitting: Physician Assistant

## 2020-07-05 NOTE — Telephone Encounter (Signed)
Last Visit: 04/13/2020 Next Visit: 07/16/2020  Last Fill: 04/08/2020  Okay to refill Methocarbamol?

## 2020-07-10 ENCOUNTER — Other Ambulatory Visit: Payer: Self-pay | Admitting: Physician Assistant

## 2020-07-10 DIAGNOSIS — M0579 Rheumatoid arthritis with rheumatoid factor of multiple sites without organ or systems involvement: Secondary | ICD-10-CM

## 2020-07-12 ENCOUNTER — Other Ambulatory Visit: Payer: Self-pay | Admitting: *Deleted

## 2020-07-12 DIAGNOSIS — Z79899 Other long term (current) drug therapy: Secondary | ICD-10-CM

## 2020-07-12 NOTE — Progress Notes (Signed)
Office Visit Note  Patient: Mary Lane             Date of Birth: Sep 18, 1960           MRN: [id number]             PCP: Lynn Ito, MD Referring: Lynn Ito, MD Visit Date: 07/21/2020 Occupation: @GUAROCC @  Subjective:  Medication monitoring.   History of Present Illness: Mary Lane is a 59 y.o. female with history of rheumatoid arthritis, Sjogren's, osteoarthritis and fibromyalgia syndrome.  She states she has been doing well on Enbrel every other week.  She denies any joint swelling.  She continues to have pain and discomfort in her joints which include her hands, knee joints and her feet.  She also has discomfort in her neck and lower back.  She states she does do stretching exercises on a daily basis.  She is also having a flare of fibromyalgia with the colder weather.  She has some Raynaud's symptoms.  Activities of Daily Living:  Patient reports morning stiffness for 1 hour.   Patient Reports nocturnal pain.  Difficulty dressing/grooming: Denies Difficulty climbing stairs: Denies Difficulty getting out of chair: Denies Difficulty using hands for taps, buttons, cutlery, and/or writing: Reports  Review of Systems  Constitutional: Positive for fatigue.  HENT: Positive for mouth dryness and nose dryness. Negative for mouth sores.   Eyes: Positive for dryness. Negative for pain, itching and visual disturbance.  Respiratory: Negative for cough, hemoptysis, shortness of breath and difficulty breathing.   Cardiovascular: Negative for chest pain, palpitations and swelling in legs/feet.  Gastrointestinal: Positive for constipation. Negative for abdominal pain, blood in stool and diarrhea.  Endocrine: Negative for increased urination.  Genitourinary: Negative for painful urination.  Musculoskeletal: Positive for arthralgias, joint pain and morning stiffness. Negative for joint swelling, myalgias, muscle weakness, muscle tenderness and myalgias.  Skin: Positive for color change.  Negative for rash and redness.  Allergic/Immunologic: Negative for susceptible to infections.  Neurological: Negative for dizziness, numbness, headaches, memory loss and weakness.  Hematological: Negative for swollen glands.  Psychiatric/Behavioral: Positive for sleep disturbance. Negative for confusion.    PMFS History:  Patient Active Problem List   Diagnosis Date Noted  . Sjogren's disease (HCC) 07/21/2020  . DDD (degenerative disc disease), cervical 07/21/2020  . Rheumatoid arthritis with rheumatoid factor of multiple sites without organ or systems involvement (HCC) 09/25/2017  . Raynaud's syndrome without gangrene 09/25/2017  . Medication monitoring encounter 09/25/2017  . Primary osteoarthritis of both hands 09/25/2017  . Primary osteoarthritis of both knees 09/25/2017  . DDD (degenerative disc disease), lumbar 09/25/2017  . Primary insomnia 09/25/2017  . Fibromyalgia 09/25/2017  . Mouth sores 09/25/2017  . Essential hypertension 09/25/2017  . History of gastroesophageal reflux (GERD) 09/25/2017  . History of depression 09/25/2017  . Vitamin D deficiency 09/25/2017  . History of hypercholesterolemia 09/25/2017  . High risk medication use 09/06/2016    Past Medical History:  Diagnosis Date  . Allergies   . Chronic GERD   . DDD (degenerative disc disease), cervical   . DDD (degenerative disc disease), lumbar   . DDD (degenerative disc disease), thoracic   . Depression   . Fibromyalgia   . High cholesterol   . Hypertension   . Klippel-Feil syndrome   . Low sodium levels   . Osteoarthritis   . Raynaud disease   . Rheumatoid arthritis (HCC)   . Shoulder pain, right   . Sjogren's disease (HCC)   . TMJ disease  Family History  Problem Relation Age of Onset  . Hypertension Mother   . Prostate cancer Father   . Hypertension Father   . Hypertension Sister   . Hypercholesterolemia Sister   . Bipolar disorder Sister   . Depression Sister   . Healthy Daughter   .  Healthy Son    Past Surgical History:  Procedure Laterality Date  . ABDOMINAL HYSTERECTOMY    . APPENDECTOMY    . BACK SURGERY    . CARPAL TUNNEL RELEASE Bilateral   . CHOLECYSTECTOMY    . ERCP    . KNEE SURGERY Bilateral   . TUBAL LIGATION     Social History   Social History Narrative  . Not on file   Immunization History  Administered Date(s) Administered  . Moderna SARS-COVID-2 Vaccination 03/29/2020, 05/06/2020  . Zoster 09/02/2013     Objective: Vital Signs: BP 132/85 (BP Location: Left Arm, Patient Position: Sitting, Cuff Size: Normal)   Pulse 60   Ht 5\' 4"  (1.626 m)   Wt 129 lb 3.2 oz (58.6 kg)   BMI 22.18 kg/m    Physical Exam Vitals and nursing note reviewed.  Constitutional:      Appearance: She is well-developed.  HENT:     Head: Normocephalic and atraumatic.  Eyes:     Conjunctiva/sclera: Conjunctivae normal.  Cardiovascular:     Rate and Rhythm: Normal rate and regular rhythm.     Heart sounds: Normal heart sounds.  Pulmonary:     Effort: Pulmonary effort is normal.     Breath sounds: Normal breath sounds.  Abdominal:     General: Bowel sounds are normal.     Palpations: Abdomen is soft.  Musculoskeletal:     Cervical back: Normal range of motion.  Lymphadenopathy:     Cervical: No cervical adenopathy.  Skin:    General: Skin is warm and dry.     Capillary Refill: Capillary refill takes less than 2 seconds.  Neurological:     Mental Status: She is alert and oriented to person, place, and time.  Psychiatric:        Behavior: Behavior normal.      Musculoskeletal Exam: She has stiffness range of motion of her cervical and lumbar spine.  There was no point tenderness.  Shoulder joints, elbow joints, wrist joints with good range of motion with no synovitis.  She has bilateral PIP and DIP thickening.  She has good range of motion of bilateral hip joints.  She has limited extension bilateral knee joints without any warmth swelling or effusion.   She had no tenderness over ankle joints or MTPs.  CDAI Exam: CDAI Score: 0.2  Patient Global: 1 mm; Provider Global: 1 mm Swollen: 0 ; Tender: 0  Joint Exam 07/21/2020   No joint exam has been documented for this visit   There is currently no information documented on the homunculus. Go to the Rheumatology activity and complete the homunculus joint exam.  Investigation: No additional findings.  Imaging: No results found.  Recent Labs: Lab Results  Component Value Date   WBC 4.5 07/12/2020   HGB 11.7 07/12/2020   PLT 246 07/12/2020   NA 136 07/12/2020   K 4.9 07/12/2020   CL 101 07/12/2020   CO2 30 07/12/2020   GLUCOSE 90 07/12/2020   BUN 9 07/12/2020   CREATININE 0.62 07/12/2020   BILITOT 0.3 07/12/2020   ALKPHOS 52 08/28/2016   AST 20 07/12/2020   ALT 16 07/12/2020   PROT 7.2  07/12/2020   ALBUMIN 4.6 08/28/2016   CALCIUM 9.9 07/12/2020   GFRAA 113 07/12/2020   QFTBGOLDPLUS NEGATIVE 10/21/2019    Speciality Comments: Patient may try to space Enbrel every 2 weeks  Procedures:  No procedures performed Allergies: Baclofen, Gabapentin, Ivp dye [iodinated diagnostic agents], and Sulfa antibiotics   Assessment / Plan:     Visit Diagnoses: Rheumatoid arthritis with rheumatoid factor of multiple sites without organ or systems involvement (HCC)-she has no active synovitis.  She has been doing well on Enbrel every other week.  She has been tolerating the medication well.  We will continue current regimen.  Sjogren's syndrome with other organ involvement (HCC) - ANA 1: 160 speckled, SSA positive, severe sicca symptoms.  She has not a started pilocarpine yet.  High risk medication use - Enbrel sq injections every 14 days-reduced dose due to neutropenia -her labs are normal and neutropenia has resolved.  We will check labs every 3 months.  Plan: QuantiFERON-TB Gold Plus with the next lab.  Raynaud's syndrome without gangrene-keeping body temperature warm was  discussed.  Primary osteoarthritis of both hands-she has bilateral PIP and DIP thickening and stiffness.  Primary osteoarthritis of both knees-she has limited extension of bilateral knee joints and chronic pain.  DDD (degenerative disc disease), cervical-she had good range of motion with a stiffness.  DDD (degenerative disc disease), lumbar-she is in chronic discomfort.  She takes tramadol which controls her symptoms quite well.  She also has been taking methocarbamol on as needed basis.  Rash and other nonspecific skin eruption-resolved.  Fibromyalgia-she is having a flare with the colder temperatures.  Primary insomnia-good sleep hygiene was discussed.  History of depression-she is on Lexapro.  She discontinued Cymbalta due to side effects.  History of hypercholesterolemia  History of hypertension-blood pressure is a stable, mildly elevated.  History of vitamin D deficiency  History of gastroesophageal reflux (GERD)  Educated about COVID-19 virus infection-she is fully vaccinated against COVID-19.  Use of booster was discussed.  Use of mask, social distancing and hand hygiene was discussed.  Instructions were placed in the AVS.  Orders: Orders Placed This Encounter  Procedures  . QuantiFERON-TB Gold Plus   No orders of the defined types were placed in this encounter.    Follow-Up Instructions: Return in about 5 months (around 12/19/2020) for Rheumatoid arthritis.   Pollyann Savoy, MD  Note - This record has been created using Animal nutritionist.  Chart creation errors have been sought, but may not always  have been located. Such creation errors do not reflect on  the standard of medical care.

## 2020-07-12 NOTE — Telephone Encounter (Signed)
Last Visit: 04/13/2020 Next Visit: 07/16/2020 Labs: 05/10/2020 Labs are stable. WBC count is low, sodium is low.  TB Gold: 10/21/2019 Neg   Current Dose per office note on 04/13/2020: Enbrel 50 mg subcutaneous injections every 14 days. Dx: Rheumatoid arthritis with rheumatoid factor of multiple sites without organ or systems involvement   Okay to refill Enbrel?

## 2020-07-13 LAB — CBC WITH DIFFERENTIAL/PLATELET
Absolute Monocytes: 441 cells/uL (ref 200–950)
Basophils Absolute: 32 cells/uL (ref 0–200)
Basophils Relative: 0.7 %
Eosinophils Absolute: 81 cells/uL (ref 15–500)
Eosinophils Relative: 1.8 %
HCT: 35.2 % (ref 35.0–45.0)
Hemoglobin: 11.7 g/dL (ref 11.7–15.5)
Lymphs Abs: 1197 cells/uL (ref 850–3900)
MCH: 31 pg (ref 27.0–33.0)
MCHC: 33.2 g/dL (ref 32.0–36.0)
MCV: 93.4 fL (ref 80.0–100.0)
MPV: 11.1 fL (ref 7.5–12.5)
Monocytes Relative: 9.8 %
Neutro Abs: 2750 cells/uL (ref 1500–7800)
Neutrophils Relative %: 61.1 %
Platelets: 246 10*3/uL (ref 140–400)
RBC: 3.77 10*6/uL — ABNORMAL LOW (ref 3.80–5.10)
RDW: 12.6 % (ref 11.0–15.0)
Total Lymphocyte: 26.6 %
WBC: 4.5 10*3/uL (ref 3.8–10.8)

## 2020-07-13 LAB — COMPLETE METABOLIC PANEL WITH GFR
AG Ratio: 1.7 (calc) (ref 1.0–2.5)
ALT: 16 U/L (ref 6–29)
AST: 20 U/L (ref 10–35)
Albumin: 4.5 g/dL (ref 3.6–5.1)
Alkaline phosphatase (APISO): 38 U/L (ref 37–153)
BUN: 9 mg/dL (ref 7–25)
CO2: 30 mmol/L (ref 20–32)
Calcium: 9.9 mg/dL (ref 8.6–10.4)
Chloride: 101 mmol/L (ref 98–110)
Creat: 0.62 mg/dL (ref 0.50–0.99)
GFR, Est African American: 113 mL/min/{1.73_m2} (ref >=60)
GFR, Est Non African American: 97 mL/min/{1.73_m2} (ref >=60)
Globulin: 2.7 g/dL (calc) (ref 1.9–3.7)
Glucose, Bld: 90 mg/dL (ref 65–99)
Potassium: 4.9 mmol/L (ref 3.5–5.3)
Sodium: 136 mmol/L (ref 135–146)
Total Bilirubin: 0.3 mg/dL (ref 0.2–1.2)
Total Protein: 7.2 g/dL (ref 6.1–8.1)

## 2020-07-16 ENCOUNTER — Ambulatory Visit: Payer: Medicare Other | Admitting: Rheumatology

## 2020-07-16 DIAGNOSIS — Z5181 Encounter for therapeutic drug level monitoring: Secondary | ICD-10-CM

## 2020-07-16 DIAGNOSIS — L568 Other specified acute skin changes due to ultraviolet radiation: Secondary | ICD-10-CM

## 2020-07-16 DIAGNOSIS — F5101 Primary insomnia: Secondary | ICD-10-CM

## 2020-07-16 DIAGNOSIS — M19041 Primary osteoarthritis, right hand: Secondary | ICD-10-CM

## 2020-07-16 DIAGNOSIS — D709 Neutropenia, unspecified: Secondary | ICD-10-CM

## 2020-07-16 DIAGNOSIS — M5136 Other intervertebral disc degeneration, lumbar region: Secondary | ICD-10-CM

## 2020-07-16 DIAGNOSIS — Z8659 Personal history of other mental and behavioral disorders: Secondary | ICD-10-CM

## 2020-07-16 DIAGNOSIS — M0579 Rheumatoid arthritis with rheumatoid factor of multiple sites without organ or systems involvement: Secondary | ICD-10-CM

## 2020-07-16 DIAGNOSIS — Z8719 Personal history of other diseases of the digestive system: Secondary | ICD-10-CM

## 2020-07-16 DIAGNOSIS — M170 Bilateral primary osteoarthritis of knee: Secondary | ICD-10-CM

## 2020-07-16 DIAGNOSIS — M3509 Sicca syndrome with other organ involvement: Secondary | ICD-10-CM

## 2020-07-16 DIAGNOSIS — I7300 Raynaud's syndrome without gangrene: Secondary | ICD-10-CM

## 2020-07-16 DIAGNOSIS — Z79899 Other long term (current) drug therapy: Secondary | ICD-10-CM

## 2020-07-16 DIAGNOSIS — Z13820 Encounter for screening for osteoporosis: Secondary | ICD-10-CM

## 2020-07-16 DIAGNOSIS — Z8679 Personal history of other diseases of the circulatory system: Secondary | ICD-10-CM

## 2020-07-16 DIAGNOSIS — Z8639 Personal history of other endocrine, nutritional and metabolic disease: Secondary | ICD-10-CM

## 2020-07-16 DIAGNOSIS — M5030 Other cervical disc degeneration, unspecified cervical region: Secondary | ICD-10-CM

## 2020-07-16 DIAGNOSIS — R21 Rash and other nonspecific skin eruption: Secondary | ICD-10-CM

## 2020-07-16 DIAGNOSIS — M797 Fibromyalgia: Secondary | ICD-10-CM

## 2020-07-21 ENCOUNTER — Other Ambulatory Visit: Payer: Self-pay | Admitting: Physician Assistant

## 2020-07-21 ENCOUNTER — Other Ambulatory Visit: Payer: Self-pay

## 2020-07-21 ENCOUNTER — Ambulatory Visit (INDEPENDENT_AMBULATORY_CARE_PROVIDER_SITE_OTHER): Payer: Medicare Other | Admitting: Rheumatology

## 2020-07-21 ENCOUNTER — Encounter: Payer: Self-pay | Admitting: Rheumatology

## 2020-07-21 VITALS — BP 132/85 | HR 60 | Ht 64.0 in | Wt 129.2 lb

## 2020-07-21 DIAGNOSIS — M3509 Sicca syndrome with other organ involvement: Secondary | ICD-10-CM | POA: Diagnosis not present

## 2020-07-21 DIAGNOSIS — Z7189 Other specified counseling: Secondary | ICD-10-CM

## 2020-07-21 DIAGNOSIS — M0579 Rheumatoid arthritis with rheumatoid factor of multiple sites without organ or systems involvement: Secondary | ICD-10-CM | POA: Diagnosis not present

## 2020-07-21 DIAGNOSIS — M19041 Primary osteoarthritis, right hand: Secondary | ICD-10-CM

## 2020-07-21 DIAGNOSIS — Z8639 Personal history of other endocrine, nutritional and metabolic disease: Secondary | ICD-10-CM

## 2020-07-21 DIAGNOSIS — M3500 Sicca syndrome, unspecified: Secondary | ICD-10-CM | POA: Insufficient documentation

## 2020-07-21 DIAGNOSIS — Z8719 Personal history of other diseases of the digestive system: Secondary | ICD-10-CM

## 2020-07-21 DIAGNOSIS — Z8679 Personal history of other diseases of the circulatory system: Secondary | ICD-10-CM

## 2020-07-21 DIAGNOSIS — M19042 Primary osteoarthritis, left hand: Secondary | ICD-10-CM

## 2020-07-21 DIAGNOSIS — M797 Fibromyalgia: Secondary | ICD-10-CM

## 2020-07-21 DIAGNOSIS — I7300 Raynaud's syndrome without gangrene: Secondary | ICD-10-CM

## 2020-07-21 DIAGNOSIS — R21 Rash and other nonspecific skin eruption: Secondary | ICD-10-CM

## 2020-07-21 DIAGNOSIS — M5136 Other intervertebral disc degeneration, lumbar region: Secondary | ICD-10-CM

## 2020-07-21 DIAGNOSIS — Z79899 Other long term (current) drug therapy: Secondary | ICD-10-CM

## 2020-07-21 DIAGNOSIS — M5030 Other cervical disc degeneration, unspecified cervical region: Secondary | ICD-10-CM

## 2020-07-21 DIAGNOSIS — F5101 Primary insomnia: Secondary | ICD-10-CM

## 2020-07-21 DIAGNOSIS — Z8659 Personal history of other mental and behavioral disorders: Secondary | ICD-10-CM

## 2020-07-21 DIAGNOSIS — M170 Bilateral primary osteoarthritis of knee: Secondary | ICD-10-CM

## 2020-07-21 NOTE — Patient Instructions (Signed)
Standing Labs We placed an order today for your standing lab work.   Please have your standing labs drawn in February and every 3 months  Please get TB Gold in February  If possible, please have your labs drawn 2 weeks prior to your appointment so that the provider can discuss your results at your appointment.  We have open lab daily Monday through Thursday from 8:30-12:30 PM and 1:30-4:30 PM and Friday from 8:30-12:30 PM and 1:30-4:00 PM at the office of Dr. Pollyann Savoy, [facility].   Please be advised, patients with office appointments requiring lab work will take precedents over walk-in lab work.  If possible, please come for your lab work on Monday and Friday afternoons, as you may experience shorter wait times. The office is located at [address], [place], [place] [address] No appointment is necessary.   Labs are drawn by [facility]. Please bring your co-pay at the time of your lab draw.  You may receive a bill from [facility] for your lab work.  If you wish to have your labs drawn at another location, please call the office 24 hours in advance to send orders.  If you have any questions regarding directions or hours of operation,  please call [phone].   As a reminder, please drink plenty of water prior to coming for your lab work. Thanks!  COVID-19 vaccine recommendations:   COVID-19 vaccine is recommended for everyone (unless you are allergic to a vaccine component), even if you are on a medication that suppresses your immune system.   Do not take Tylenol or any anti-inflammatory medications (NSAIDs) 24 hours prior to the COVID-19 vaccination.   There is no direct evidence about the efficacy of the COVID-19 vaccine in individuals who are on medications that suppress the immune system.   Even if you are fully vaccinated, and you are on any medications that suppress your immune system, please continue to wear a mask, maintain at least six feet social  distance and practice hand hygiene.   If you develop a COVID-19 infection, please contact your PCP or our office to determine if you need monoclonal antibody infusion.  The booster vaccine is now available for immunocompromised patients.   Please see the following web sites for updated information.   [email or web address]

## 2020-07-21 NOTE — Telephone Encounter (Signed)
Last Visit: 04/13/2020 Next Visit: 07/16/2020 UDS: 04/13/2020 Narc Agreement: 04/13/2020  Last Fill: 05/20/2020  Current Dose per office note on 04/13/2020:  tramadol50 mg 2 tablets p.o. twice daily  Okay to refill Tramadol?  

## 2020-08-15 ENCOUNTER — Telehealth: Payer: Self-pay | Admitting: Physician Assistant

## 2020-08-16 NOTE — Telephone Encounter (Signed)
Please clarify why the patient has not scheduled an appointment with patient management.   Prescription cannot be refilled until 08/21/20.

## 2020-08-16 NOTE — Telephone Encounter (Signed)
Patient states she has not had a chance yet. Patient states her mother has been diagnosed with dementia/Alhiemzier. Patient states she has been trying to get her mother to appointments.

## 2020-08-16 NOTE — Telephone Encounter (Signed)
Last Visit: 07/21/2020 Next Visit: 12/20/2020 UDS: 04/13/2020 Narc Agreement: 04/13/2020  Last Fill: 07/21/2020  Patient was referred to pain management on 04/13/2020. Patient was to call them back to schedule and has not done so at this time.  Okay to refill Tramadol?

## 2020-08-17 NOTE — Telephone Encounter (Signed)
Please notify the patient that we are unable to fill prescription early.  We will have to send prescription on 08/21/20

## 2020-08-17 NOTE — Telephone Encounter (Signed)
Patient advised we will be unable to refill prescription until the time it is due as the pharmacy is unable to hold the prescription that was sent on 08/17/2020. Pharmacy advised we will resubmit the prescription when it is due. Pharmacy will cancel prescription and patient expressed understanding.

## 2020-08-17 NOTE — Telephone Encounter (Signed)
[facility] calling in reference to Tramadol Rx. They are unable to hold Rx until 08/21/2020 due to it being a controled substance. How would you like to proceed? Please call #[phone]. Ref # [id number]

## 2020-08-23 ENCOUNTER — Telehealth: Payer: Self-pay | Admitting: Rheumatology

## 2020-08-23 MED ORDER — TRAMADOL HCL 50 MG PO TABS: ORAL_TABLET | ORAL | 0 refills | Status: DC

## 2020-08-23 NOTE — Telephone Encounter (Signed)
Patient requesting a refill on Tramadol, sent to [facility].

## 2020-08-23 NOTE — Telephone Encounter (Signed)
Last Visit: 12/82021 Next Visit: 12/20/2020 UDS: 04/13/2020, UDS is consistent with tramadol use Narc Agreement: 04/13/2020  Last Fill: 07/21/2020  Okay to refill Tramadol?

## 2020-10-15 ENCOUNTER — Other Ambulatory Visit: Payer: Self-pay | Admitting: *Deleted

## 2020-10-15 ENCOUNTER — Other Ambulatory Visit: Payer: Self-pay | Admitting: Physician Assistant

## 2020-10-15 DIAGNOSIS — M0579 Rheumatoid arthritis with rheumatoid factor of multiple sites without organ or systems involvement: Secondary | ICD-10-CM

## 2020-10-15 DIAGNOSIS — Z79899 Other long term (current) drug therapy: Secondary | ICD-10-CM

## 2020-10-16 NOTE — Progress Notes (Signed)
WBC count is low at 3.1.  CMP is normal.  We will continue to monitor labs.

## 2020-10-17 LAB — COMPLETE METABOLIC PANEL WITH GFR
AG Ratio: 1.7 (calc) (ref 1.0–2.5)
ALT: 18 U/L (ref 6–29)
AST: 23 U/L (ref 10–35)
Albumin: 4.6 g/dL (ref 3.6–5.1)
Alkaline phosphatase (APISO): 51 U/L (ref 37–153)
BUN: 9 mg/dL (ref 7–25)
CO2: 29 mmol/L (ref 20–32)
Calcium: 9.8 mg/dL (ref 8.6–10.4)
Chloride: 99 mmol/L (ref 98–110)
Creat: 0.72 mg/dL (ref 0.50–0.99)
GFR, Est African American: 105 mL/min/{1.73_m2} (ref >=60)
GFR, Est Non African American: 90 mL/min/{1.73_m2} (ref >=60)
Globulin: 2.7 g/dL (calc) (ref 1.9–3.7)
Glucose, Bld: 85 mg/dL (ref 65–99)
Potassium: 4.7 mmol/L (ref 3.5–5.3)
Sodium: 137 mmol/L (ref 135–146)
Total Bilirubin: 0.2 mg/dL (ref 0.2–1.2)
Total Protein: 7.3 g/dL (ref 6.1–8.1)

## 2020-10-17 LAB — QUANTIFERON-TB GOLD PLUS
Mitogen-NIL: >10 IU/mL
NIL: 0.05 IU/mL
QuantiFERON-TB Gold Plus: NEGATIVE
TB1-NIL: <0 IU/mL
TB2-NIL: <0 IU/mL

## 2020-10-17 LAB — CBC WITH DIFFERENTIAL/PLATELET
Absolute Monocytes: 400 cells/uL (ref 200–950)
Basophils Absolute: 31 cells/uL (ref 0–200)
Basophils Relative: 1 %
Eosinophils Absolute: 40 cells/uL (ref 15–500)
Eosinophils Relative: 1.3 %
HCT: 37.5 % (ref 35.0–45.0)
Hemoglobin: 12.5 g/dL (ref 11.7–15.5)
Lymphs Abs: 1287 cells/uL (ref 850–3900)
MCH: 30.4 pg (ref 27.0–33.0)
MCHC: 33.3 g/dL (ref 32.0–36.0)
MCV: 91.2 fL (ref 80.0–100.0)
MPV: 11.6 fL (ref 7.5–12.5)
Monocytes Relative: 12.9 %
Neutro Abs: 1342 cells/uL — ABNORMAL LOW (ref 1500–7800)
Neutrophils Relative %: 43.3 %
Platelets: 226 10*3/uL (ref 140–400)
RBC: 4.11 10*6/uL (ref 3.80–5.10)
RDW: 12.1 % (ref 11.0–15.0)
Total Lymphocyte: 41.5 %
WBC: 3.1 10*3/uL — ABNORMAL LOW (ref 3.8–10.8)

## 2020-10-18 NOTE — Progress Notes (Signed)
TB Gold negative

## 2020-10-18 NOTE — Telephone Encounter (Signed)
Last Visit: 07/21/2020  Next Visit: 12/20/2020 UDS: 8/31/2021UDS is consistent with tramadol use. Narc Agreement: 03/26/2020  Last Fill: 08/23/2020  Okay to refill  Tramadol?  Labs: 10/15/2020, WBC count is low at 3.1. CMP is normal. We will continue to monitor labs. TB Gold: 10/15/2020, negative  Current Dose per office note 07/21/2020,  Enbrel sq injections every 14 days-reduced dose due to neutropenia -her labs are normal and neutropenia has resolved [id number] arthritis with rheumatoid factor of multiple sites without organ or systems involvement   Last Fill: 07/12/2020  Okay to refill Enbrel?

## 2020-11-23 ENCOUNTER — Other Ambulatory Visit: Payer: Self-pay | Admitting: Rheumatology

## 2020-11-24 NOTE — Telephone Encounter (Addendum)
Last Visit: 12/20/2020 Next Visit: 07/21/2020 UDS: 04/13/2020, UDS is consistent with tramadol use. Narc Agreement: 04/13/2020  Last Fill: 10/18/2020  Okay to refill Tramadol?

## 2020-11-25 ENCOUNTER — Other Ambulatory Visit: Payer: Self-pay | Admitting: Physician Assistant

## 2020-11-29 NOTE — Telephone Encounter (Signed)
Next Visit: 12/20/2020 Last Visit: 07/21/2020  Last Fill: 07/05/2020 (90 day)  Okay to refill robaxin?

## 2020-12-06 NOTE — Progress Notes (Signed)
Office Visit Note  Patient: Mary Lane             Date of Birth: April 07, 1961           MRN: [id number]             PCP: Lynn Ito, MD Referring: Lynn Ito, MD Visit Date: 12/20/2020 Occupation: @GUAROCC @  Subjective:  Pain in multiple joints   History of Present Illness: Giuseppina Quinones is a 60 y.o. female with history of seropositive rheumatoid arthritis, Sjogren's syndrome, osteoarthritis, and DDD.  Patient is currently on Enbrel 50 mg subcutaneous injections every 14 days.  She was due for her last dose on Thursday, 12/16/2020 but her Enbrel auto touch device died and was unable to inject the medication.  She continues to have pain and stiffness in multiple joints.  She states she has increased pain and inflammation during the second week of her injection cycle.  She is currently spacing Enbrel to every 14 days due to low Arai blood cell count in the past.  She has not had any recent infections.  She is having pain in every joint minus her elbow joints.  She has intermittent swelling in both knees and both feet.  She has been having to take Tylenol and ibuprofen on a daily basis for pain relief.  She also takes methocarbamol 500 mg twice daily as needed for muscle spasms. She has not established care with management yet. She has ongoing sicca symptoms.  She has an upcoming appointment with her dentist as well as ophthalmologist this month.  She has noticed worsening of the rash on her chest as well as upper extremities.  She has not had a biopsy yet. She has planning on following up with her dermatologist for further evaluation.  She has not been using any topical agents. She has been trying to avoid direct sun exposure.   Activities of Daily Living:  Patient reports morning stiffness for several hours.   Patient Reports nocturnal pain.  Difficulty dressing/grooming: Denies Difficulty climbing stairs: Denies Difficulty getting out of chair: Denies Difficulty using hands for taps,  buttons, cutlery, and/or writing: Reports  Review of Systems  Constitutional: Positive for fatigue.  HENT: Positive for mouth sores, mouth dryness and nose dryness.   Eyes: Positive for dryness. Negative for pain and itching.  Respiratory: Negative for shortness of breath and difficulty breathing.   Cardiovascular: Negative for chest pain and palpitations.  Gastrointestinal: Negative for blood in stool, constipation and diarrhea.  Endocrine: Negative for increased urination.  Genitourinary: Negative for difficulty urinating.  Musculoskeletal: Positive for arthralgias, joint pain, joint swelling, myalgias, morning stiffness, muscle tenderness and myalgias.  Skin: Positive for color change and rash. Negative for redness.  Allergic/Immunologic: Negative for susceptible to infections.  Neurological: Positive for numbness, headaches and weakness. Negative for dizziness and memory loss.  Hematological: Negative for bruising/bleeding tendency.  Psychiatric/Behavioral: Negative for confusion.    PMFS History:  Patient Active Problem List   Diagnosis Date Noted  . Sjogren's disease (HCC) 07/21/2020  . DDD (degenerative disc disease), cervical 07/21/2020  . Rheumatoid arthritis with rheumatoid factor of multiple sites without organ or systems involvement (HCC) 09/25/2017  . Raynaud's syndrome without gangrene 09/25/2017  . Medication monitoring encounter 09/25/2017  . Primary osteoarthritis of both hands 09/25/2017  . Primary osteoarthritis of both knees 09/25/2017  . DDD (degenerative disc disease), lumbar 09/25/2017  . Primary insomnia 09/25/2017  . Fibromyalgia 09/25/2017  . Mouth sores 09/25/2017  . Essential hypertension 09/25/2017  .  History of gastroesophageal reflux (GERD) 09/25/2017  . History of depression 09/25/2017  . Vitamin D deficiency 09/25/2017  . History of hypercholesterolemia 09/25/2017  . High risk medication use 09/06/2016    Past Medical History:  Diagnosis Date   . Allergies   . Chronic GERD   . DDD (degenerative disc disease), cervical   . DDD (degenerative disc disease), lumbar   . DDD (degenerative disc disease), thoracic   . Depression   . Fibromyalgia   . High cholesterol   . Hypertension   . Klippel-Feil syndrome   . Low sodium levels   . Osteoarthritis   . Raynaud disease   . Rheumatoid arthritis (HCC)   . Shoulder pain, right   . Sjogren's disease (HCC)   . TMJ disease     Family History  Problem Relation Age of Onset  . Hypertension Mother   . Dementia Mother   . Alzheimer's disease Mother   . Prostate cancer Father   . Hypertension Father   . Hypertension Sister   . Hypercholesterolemia Sister   . Bipolar disorder Sister   . Depression Sister   . Healthy Daughter   . Healthy Son    Past Surgical History:  Procedure Laterality Date  . ABDOMINAL HYSTERECTOMY    . APPENDECTOMY    . BACK SURGERY    . CARPAL TUNNEL RELEASE Bilateral   . CHOLECYSTECTOMY    . ERCP    . KNEE SURGERY Bilateral   . TUBAL LIGATION     Social History   Social History Narrative  . Not on file   Immunization History  Administered Date(s) Administered  . Moderna Sars-Covid-2 Vaccination 03/29/2020, 05/06/2020  . Zoster 09/02/2013     Objective: Vital Signs: BP (!) 143/80 (BP Location: Left Arm, Patient Position: Sitting, Cuff Size: Normal)   Pulse 69   Resp 14   Ht 5' 4.75" (1.645 m)   Wt 133 lb (60.3 kg)   BMI 22.30 kg/m    Physical Exam Vitals and nursing note reviewed.  Constitutional:      Appearance: She is well-developed.  HENT:     Head: Normocephalic and atraumatic.  Eyes:     Conjunctiva/sclera: Conjunctivae normal.  Pulmonary:     Effort: Pulmonary effort is normal.  Abdominal:     Palpations: Abdomen is soft.  Musculoskeletal:     Cervical back: Normal range of motion.  Skin:    General: Skin is warm and dry.     Capillary Refill: Capillary refill takes less than 2 seconds.  Neurological:     Mental  Status: She is alert and oriented to person, place, and time.  Psychiatric:        Behavior: Behavior normal.      Musculoskeletal Exam: C-spine limited ROM.  Midline spinal tenderness over the C-spine and thoracic spine.  Shoulder joints have good ROM with no discomfort.  Elbow joints good ROM with no tenderness or joint swelling.  Slightly limited ROM of both wrist joints.  Tenderness over the left CMC joint.  PIP and DIP thickening consistent with osteoarthritis of both hands.  Hip joints good ROM with some discomfort in the right hip.  Knee joints have limited extension bilaterally.  No warmth or effusion of knee joints.  Mild synovial thickening of both knees and both ankle joints.   CDAI Exam: CDAI Score: -- Patient Global: --; Provider Global: -- Swollen: --; Tender: -- Joint Exam 12/20/2020   No joint exam has been documented for this  visit   There is currently no information documented on the homunculus. Go to the Rheumatology activity and complete the homunculus joint exam.  Investigation: No additional findings.  Imaging: No results found.  Recent Labs: Lab Results  Component Value Date   WBC 3.1 (L) 10/15/2020   HGB 12.5 10/15/2020   PLT 226 10/15/2020   NA 137 10/15/2020   K 4.7 10/15/2020   CL 99 10/15/2020   CO2 29 10/15/2020   GLUCOSE 85 10/15/2020   BUN 9 10/15/2020   CREATININE 0.72 10/15/2020   BILITOT 0.2 10/15/2020   ALKPHOS 52 08/28/2016   AST 23 10/15/2020   ALT 18 10/15/2020   PROT 7.3 10/15/2020   ALBUMIN 4.6 08/28/2016   CALCIUM 9.8 10/15/2020   GFRAA 105 10/15/2020   QFTBGOLDPLUS NEGATIVE 10/15/2020    Speciality Comments: Patient may try to space Enbrel every 2 weeks  Procedures:  No procedures performed Allergies: Baclofen, Gabapentin, Ivp dye [iodinated diagnostic agents], and Sulfa antibiotics   Assessment / Plan:     Visit Diagnoses: Rheumatoid arthritis with rheumatoid factor of multiple sites without organ or systems involvement  (HCC) -She presents today with increased pain and stiffness in multiple joints over the past several months. She is currently on Enbrel 50 mg subcutaneous injections every 14 days.  She is spacing the dose of Enbrel due to history of neutropenia.  [place]lick blood cell count was 3.1 and absolute neutrophils were 1,342 on 10/15/20.  She is experiencing increased pain and stiffness since having to space the dose of Enbrel.  She notices intermittent swelling in both knees and both feet.  On examination today she has limited extension of both knees with discomfort.  No warmth or effusion was noted on exam.  We discussed repeating CBC and CMP today and if labs are stable she can try increasing the dose of Enbrel to 50 mg every 10 days instead of every 14 days.  She will require frequent lab monitoring.  She will return for lab work in 1 month and every 3 months.  A new referral for pain management in [place] was also placed today to try to better manage her discomfort due to underlying osteoarthritis and previous damage from rheumatoid arthritis.   Plan: Ambulatory referral to Pain Clinic, Etanercept (ENBREL MINI) 50 MG/ML SOCT  High risk medication use - Enbrel sq injections every 10 days-reduced dose due to neutropenia -results from 10/15/2020 were reviewed with the patient today in the office.  [place] blood cell count was 3.1 and absolute neutrophils were 1,342.  We will check CBC and CMP today, in 1 month, then every 3 months.  Standing orders for CBC and CMP remain in place.  TB gold negative on 10/15/2020 and will continue to be monitored yearly. Plan: CBC with Differential/Platelet, COMPLETE METABOLIC PANEL WITH GFR She is aware that she is to hold Enbrel if she develops signs or symptoms of an infection and to resume once the infection has completely cleared. New autotouch device was provided to the patient today.  Sjogren's syndrome with other organ involvement (HCC) - ANA 1: 160 speckled, SSA positive, severe  sicca symptoms: She continues to have chronic sicca symptoms which have been tolerable overall.  She has not started on pilocarpine yet.  She has an upcoming appointment with her dentist and ophthalmologist this month. She has noticed a rash on her chest and upper extremities started to return.  She has been avoiding direct sun exposure and is unsure of what has  triggered the rash to return.  She was advised to schedule an appointment with her dermatologist for further evaluation and to discuss obtaining a biopsy to confirm the diagnosis.  Medication monitoring encounter -She takes tramadol as needed for pain relief.  Patient was previously referred to pain management but has not established care.  A new referral for pain management in [place] was placed today for better convenience.  Raynaud's syndrome without gangrene: Not currently active.  No signs of digital ulcerations or sclerodactyly noted.   Primary osteoarthritis of both hands: She has PIP and DIP thickening consistent with osteoarthritis of both hands.  CMC joint thickening noted.  She has tenderness palpation over the left CMC and first MCP joint.  No inflammation was noted.  Discussed the importance of joint protection and muscle strengthening. She has been taking Tylenol and ibuprofen on a daily basis for pain management.  She has not establish care with a pain clinic yet.  A new referral for pain management in [place] was placed today.  Primary osteoarthritis of both knees - She continues to have chronic pain in both knee joints.  She has limited extension of both knees.  No warmth or effusion noted. Plan: Ambulatory referral to Pain Clinic  DDD (degenerative disc disease), cervical - Plan: Ambulatory referral to Pain Clinic  DDD (degenerative disc disease), lumbar - Plan: Ambulatory referral to Pain Clinic  Rash and other nonspecific skin eruption - resolved.  Fibromyalgia - She has intermittent myalgias and muscle tenderness.   She takes methocarbamol 500 mg twice daily as needed for muscle spasms.  Plan: Ambulatory referral to Pain Clinic  Primary insomnia: She continues to have interrupted sleep at night due to nocturnal pain.  Other medical conditions are listed as follows:   History of depression - She is on Lexapro.  She discontinued Cymbalta due to side effects.  History of hypertension  History of vitamin D deficiency  History of gastroesophageal reflux (GERD)  History of hypercholesterolemia  Orders: Orders Placed This Encounter  Procedures  . CBC with Differential/Platelet  . COMPLETE METABOLIC PANEL WITH GFR  . Ambulatory referral to Pain Clinic   Meds ordered this encounter  Medications  . Etanercept (ENBREL MINI) 50 MG/ML SOCT    Sig: INJECT 50MG  INTO THE SKIN EVERY 10 DAYS.    Dispense:  4 mL    Refill:  1     Follow-Up Instructions: Return in 3 months (on 03/22/2021) for Rheumatoid arthritis, Osteoarthritis, Sjogren's syndrome.   05/22/2021, PA-C  Note - This record has been created using Dragon software.  Chart creation errors have been sought, but may not always  have been located. Such creation errors do not reflect on  the standard of medical care.

## 2020-12-20 ENCOUNTER — Other Ambulatory Visit: Payer: Self-pay

## 2020-12-20 ENCOUNTER — Ambulatory Visit (INDEPENDENT_AMBULATORY_CARE_PROVIDER_SITE_OTHER): Payer: Medicare Other | Admitting: Physician Assistant

## 2020-12-20 ENCOUNTER — Encounter: Payer: Self-pay | Admitting: Physician Assistant

## 2020-12-20 VITALS — BP 143/80 | HR 69 | Resp 14 | Ht 64.75 in | Wt 133.0 lb

## 2020-12-20 DIAGNOSIS — Z8679 Personal history of other diseases of the circulatory system: Secondary | ICD-10-CM

## 2020-12-20 DIAGNOSIS — Z8659 Personal history of other mental and behavioral disorders: Secondary | ICD-10-CM

## 2020-12-20 DIAGNOSIS — F5101 Primary insomnia: Secondary | ICD-10-CM

## 2020-12-20 DIAGNOSIS — M0579 Rheumatoid arthritis with rheumatoid factor of multiple sites without organ or systems involvement: Secondary | ICD-10-CM | POA: Diagnosis not present

## 2020-12-20 DIAGNOSIS — M5030 Other cervical disc degeneration, unspecified cervical region: Secondary | ICD-10-CM

## 2020-12-20 DIAGNOSIS — Z79899 Other long term (current) drug therapy: Secondary | ICD-10-CM | POA: Diagnosis not present

## 2020-12-20 DIAGNOSIS — Z5181 Encounter for therapeutic drug level monitoring: Secondary | ICD-10-CM | POA: Diagnosis not present

## 2020-12-20 DIAGNOSIS — M170 Bilateral primary osteoarthritis of knee: Secondary | ICD-10-CM

## 2020-12-20 DIAGNOSIS — Z8719 Personal history of other diseases of the digestive system: Secondary | ICD-10-CM

## 2020-12-20 DIAGNOSIS — M19042 Primary osteoarthritis, left hand: Secondary | ICD-10-CM

## 2020-12-20 DIAGNOSIS — R21 Rash and other nonspecific skin eruption: Secondary | ICD-10-CM

## 2020-12-20 DIAGNOSIS — M3509 Sicca syndrome with other organ involvement: Secondary | ICD-10-CM

## 2020-12-20 DIAGNOSIS — M797 Fibromyalgia: Secondary | ICD-10-CM

## 2020-12-20 DIAGNOSIS — M19041 Primary osteoarthritis, right hand: Secondary | ICD-10-CM

## 2020-12-20 DIAGNOSIS — I7300 Raynaud's syndrome without gangrene: Secondary | ICD-10-CM

## 2020-12-20 DIAGNOSIS — M5136 Other intervertebral disc degeneration, lumbar region: Secondary | ICD-10-CM

## 2020-12-20 DIAGNOSIS — Z8639 Personal history of other endocrine, nutritional and metabolic disease: Secondary | ICD-10-CM

## 2020-12-20 MED ORDER — ENBREL MINI 50 MG/ML ~~LOC~~ SOCT
SUBCUTANEOUS | 1 refills | Status: DC
Start: 2020-12-20 — End: 2021-02-22

## 2020-12-20 NOTE — Progress Notes (Signed)
Patient presented to [facility] and had non-responsive Auto-injector. Enbrel mini medication stuck inside device.  Autotouch injector device provided to the patient. She states she has medication at home and does not require sample today.  Drug name: Auto-touch Injector Device           Qty: 1 device LOT: [id number] Exp.Date: 11/11/2021  We discussed reaching out to [employer] or our clinic when she sees low battery sign on device  Murrell Redden 9:48 AM 12/20/2020

## 2020-12-20 NOTE — Patient Instructions (Signed)
Standing Labs We placed an order today for your standing lab work.   Please have your standing labs drawn in 1 month then every 3 months   If possible, please have your labs drawn 2 weeks prior to your appointment so that the provider can discuss your results at your appointment.  We have open lab daily Monday through Thursday from 1:30-4:30 PM and Friday from 1:30-4:00 PM at the office of Dr. Shaili Deveshwar, [facility].   Please be advised, all patients with office appointments requiring lab work will take precedents over walk-in lab work.  If possible, please come for your lab work on Monday and Friday afternoons, as you may experience shorter wait times. The office is located at [address], [address], [place], [place] [address] No appointment is necessary.   Labs are drawn by [facility]. Please bring your co-pay at the time of your lab draw.  You may receive a bill from [facility] for your lab work.  If you wish to have your labs drawn at another location, please call the office 24 hours in advance to send orders.  If you have any questions regarding directions or hours of operation,  please call [phone].   As a reminder, please drink plenty of water prior to coming for your lab work. Thanks!   

## 2020-12-21 LAB — COMPLETE METABOLIC PANEL WITH GFR
AG Ratio: 1.7 (calc) (ref 1.0–2.5)
ALT: 14 U/L (ref 6–29)
AST: 21 U/L (ref 10–35)
Albumin: 4.4 g/dL (ref 3.6–5.1)
Alkaline phosphatase (APISO): 45 U/L (ref 37–153)
BUN: 9 mg/dL (ref 7–25)
CO2: 29 mmol/L (ref 20–32)
Calcium: 9.5 mg/dL (ref 8.6–10.4)
Chloride: 101 mmol/L (ref 98–110)
Creat: 0.62 mg/dL (ref 0.50–0.99)
GFR, Est African American: 113 mL/min/{1.73_m2} (ref >=60)
GFR, Est Non African American: 97 mL/min/{1.73_m2} (ref >=60)
Globulin: 2.6 g/dL (calc) (ref 1.9–3.7)
Glucose, Bld: 87 mg/dL (ref 65–99)
Potassium: 4.8 mmol/L (ref 3.5–5.3)
Sodium: 136 mmol/L (ref 135–146)
Total Bilirubin: 0.3 mg/dL (ref 0.2–1.2)
Total Protein: 7 g/dL (ref 6.1–8.1)

## 2020-12-21 LAB — CBC WITH DIFFERENTIAL/PLATELET
Absolute Monocytes: 477 cells/uL (ref 200–950)
Basophils Absolute: 19 cells/uL (ref 0–200)
Basophils Relative: 0.5 %
Eosinophils Absolute: 59 cells/uL (ref 15–500)
Eosinophils Relative: 1.6 %
HCT: 35.1 % (ref 35.0–45.0)
Hemoglobin: 11.3 g/dL — ABNORMAL LOW (ref 11.7–15.5)
Lymphs Abs: 1143 cells/uL (ref 850–3900)
MCH: 29.2 pg (ref 27.0–33.0)
MCHC: 32.2 g/dL (ref 32.0–36.0)
MCV: 90.7 fL (ref 80.0–100.0)
MPV: 11.5 fL (ref 7.5–12.5)
Monocytes Relative: 12.9 %
Neutro Abs: 2002 cells/uL (ref 1500–7800)
Neutrophils Relative %: 54.1 %
Platelets: 198 10*3/uL (ref 140–400)
RBC: 3.87 10*6/uL (ref 3.80–5.10)
RDW: 13.2 % (ref 11.0–15.0)
Total Lymphocyte: 30.9 %
WBC: 3.7 10*3/uL — ABNORMAL LOW (ref 3.8–10.8)

## 2020-12-21 NOTE — Progress Notes (Signed)
CMP WNL.  WBC count is borderline low but has improved-3.7.  absolute neutrophils are WNL.  Hgb is slightly low.    Ok to try spacing enbrel from every 14 to every 10 days.  Recheck lab work in 1 month.

## 2021-01-05 ENCOUNTER — Other Ambulatory Visit: Payer: Self-pay | Admitting: Physician Assistant

## 2021-01-06 NOTE — Telephone Encounter (Signed)
Last Visit: 12/20/2020 Next Visit: 03/30/2021 UDS: 04/13/2020 UDS is consistent with tramadol use. Narc Agreement: 03/16/2020  Last Fill: 11/24/2020  Okay to refill Tramadol?

## 2021-01-18 ENCOUNTER — Other Ambulatory Visit: Payer: Self-pay | Admitting: Rheumatology

## 2021-02-22 ENCOUNTER — Other Ambulatory Visit: Payer: Self-pay | Admitting: Rheumatology

## 2021-02-22 DIAGNOSIS — M0579 Rheumatoid arthritis with rheumatoid factor of multiple sites without organ or systems involvement: Secondary | ICD-10-CM

## 2021-02-22 NOTE — Telephone Encounter (Signed)
Please clarify why there are 5 refills provided.

## 2021-02-22 NOTE — Telephone Encounter (Signed)
Next Visit: 03/30/2021  Last Visit: 12/20/2020  Last Fill: 12/20/2020  [id number] arthritis with rheumatoid factor of multiple sites without organ or systems involvement  Current Dose per office note 12/20/2020: Enbrel sq injections every 10 days-reduced dose due to neutropenia  Labs: 12/20/2020 CMP WNL.  WBC count is borderline low but has improved-3.7.  absoluteneutrophils are WNL.  Hgb is slightly low.     TB Gold: 10/15/2020 Neg    Okay to refill Enbrel?

## 2021-03-16 NOTE — Progress Notes (Signed)
Office Visit Note  Patient: Mary Lane             Date of Birth: [birth year]           MRN: [id number]             PCP: Lynn Ito, MD Referring: Lynn Ito, MD Visit Date: 03/30/2021 Occupation: @GUAROCC @  Subjective:  Medication management   History of Present Illness: Mary Lane is a 60 y.o. female with a history of rheumatoid arthritis, osteoarthritis, degenerative disc disease and fibromyalgia.  She states she has been going to pain management now.  She had some epidural injection in the cervical spine which was helpful.  She is also getting nerve ablation in the cervical region on the 29th of this month.  She had some interruption of febrile treatment.  She denies any joint swelling.  Although she has been having a flare of fibromyalgia with increased pain all over.  She continues to have discomfort in her neck and lower back.  She has been taking tramadol on a regular basis for pain management.  Enbrel dose 50 mg subcu has been every 10 days.  Activities of Daily Living:  Patient reports morning stiffness for 30 minutes.   Patient Reports nocturnal pain.  Difficulty dressing/grooming: Denies Difficulty climbing stairs: Denies Difficulty getting out of chair: Denies Difficulty using hands for taps, buttons, cutlery, and/or writing: Reports  Review of Systems  Constitutional:  Positive for fatigue.  HENT:  Positive for mouth dryness and nose dryness. Negative for mouth sores.   Eyes:  Positive for dryness. Negative for pain and itching.  Respiratory:  Negative for shortness of breath and difficulty breathing.   Cardiovascular:  Negative for chest pain and palpitations.  Gastrointestinal:  Negative for blood in stool, constipation and diarrhea.  Endocrine: Negative for increased urination.  Genitourinary:  Negative for difficulty urinating.  Musculoskeletal:  Positive for joint pain, joint pain, joint swelling, myalgias, morning stiffness, muscle tenderness and myalgias.   Skin:  Positive for rash. Negative for color change.  Allergic/Immunologic: Negative for susceptible to infections.  Neurological:  Positive for weakness. Negative for dizziness, numbness, headaches and memory loss.  Hematological:  Negative for bruising/bleeding tendency.  Psychiatric/Behavioral:  Negative for confusion.    PMFS History:  Patient Active Problem List   Diagnosis Date Noted   Sjogren's disease (HCC) 07/21/2020   DDD (degenerative disc disease), cervical 07/21/2020   Rheumatoid arthritis with rheumatoid factor of multiple sites without organ or systems involvement (HCC) 09/25/2017   Raynaud's syndrome without gangrene 09/25/2017   Medication monitoring encounter 09/25/2017   Primary osteoarthritis of both hands 09/25/2017   Primary osteoarthritis of both knees 09/25/2017   DDD (degenerative disc disease), lumbar 09/25/2017   Primary insomnia 09/25/2017   Fibromyalgia 09/25/2017   Mouth sores 09/25/2017   Essential hypertension 09/25/2017   History of gastroesophageal reflux (GERD) 09/25/2017   History of depression 09/25/2017   Vitamin D deficiency 09/25/2017   History of hypercholesterolemia 09/25/2017   High risk medication use 09/06/2016    Past Medical History:  Diagnosis Date   Allergies    Chronic GERD    DDD (degenerative disc disease), cervical    DDD (degenerative disc disease), lumbar    DDD (degenerative disc disease), thoracic    Depression    Fibromyalgia    High cholesterol    Hypertension    Klippel-Feil syndrome    Low sodium levels    Osteoarthritis    Raynaud disease  Rheumatoid arthritis (HCC)    Shoulder pain, right    Sjogren's disease (HCC)    TMJ disease     Family History  Problem Relation Age of Onset   Hypertension Mother    Dementia Mother    Alzheimer's disease Mother    Prostate cancer Father    Hypertension Father    Hypertension Sister    Hypercholesterolemia Sister    Bipolar disorder Sister    Depression  Sister    Healthy Daughter    Healthy Son    Past Surgical History:  Procedure Laterality Date   ABDOMINAL HYSTERECTOMY     APPENDECTOMY     BACK SURGERY     CARPAL TUNNEL RELEASE Bilateral    CHOLECYSTECTOMY     epidural     cervical and cervical block   ERCP     KNEE SURGERY Bilateral    TUBAL LIGATION     Social History   Social History Narrative   Not on file   Immunization History  Administered Date(s) Administered   Moderna Sars-Covid-2 Vaccination 03/29/2020, 05/06/2020   Zoster, Live 09/02/2013     Objective: Vital Signs: BP (!) 153/81 (BP Location: Left Arm, Patient Position: Sitting, Cuff Size: Normal)   Pulse 60   Ht 5\' 4"  (1.626 m)   Wt 128 lb 9.6 oz (58.3 kg)   BMI 22.07 kg/m    Physical Exam Vitals and nursing note reviewed.  Constitutional:      Appearance: She is well-developed.  HENT:     Head: Normocephalic and atraumatic.  Eyes:     Conjunctiva/sclera: Conjunctivae normal.  Cardiovascular:     Rate and Rhythm: Normal rate and regular rhythm.     Heart sounds: Normal heart sounds.  Pulmonary:     Effort: Pulmonary effort is normal.     Breath sounds: Normal breath sounds.  Abdominal:     General: Bowel sounds are normal.     Palpations: Abdomen is soft.  Musculoskeletal:     Cervical back: Normal range of motion.  Lymphadenopathy:     Cervical: No cervical adenopathy.  Skin:    General: Skin is warm and dry.     Capillary Refill: Capillary refill takes less than 2 seconds.  Neurological:     Mental Status: She is alert and oriented to person, place, and time.  Psychiatric:        Behavior: Behavior normal.     Musculoskeletal Exam: She had limited painful range of motion of cervical spine.  She has some discomfort range of motion of her lumbar spine.  Shoulder joints, elbow joints, wrist joints, MCPs PIPs and DIPs with good range of motion with no synovitis.  Hip joints, knee joints, ankles, MTPs and PIPs with good range of motion  with no synovitis.  CDAI Exam: CDAI Score: 0.4  Patient Global: 2 mm; Provider Global: 2 mm Swollen: 0 ; Tender: 0  Joint Exam 03/30/2021   No joint exam has been documented for this visit   There is currently no information documented on the homunculus. Go to the Rheumatology activity and complete the homunculus joint exam.  Investigation: No additional findings.  Imaging: No results found.  Recent Labs: Lab Results  Component Value Date   WBC 3.7 (L) 12/20/2020   HGB 11.3 (L) 12/20/2020   PLT 198 12/20/2020   NA 136 12/20/2020   K 4.8 12/20/2020   CL 101 12/20/2020   CO2 29 12/20/2020   GLUCOSE 87 12/20/2020   BUN  9 12/20/2020   CREATININE 0.62 12/20/2020   BILITOT 0.3 12/20/2020   ALKPHOS 52 08/28/2016   AST 21 12/20/2020   ALT 14 12/20/2020   PROT 7.0 12/20/2020   ALBUMIN 4.6 08/28/2016   CALCIUM 9.5 12/20/2020   GFRAA 113 12/20/2020   QFTBGOLDPLUS NEGATIVE 10/15/2020    Speciality Comments: Patient may try to space Enbrel every 2 weeks  Procedures:  No procedures performed Allergies: Baclofen, Gabapentin, Ivp dye [iodinated diagnostic agents], and Sulfa antibiotics   Assessment / Plan:     Visit Diagnoses: 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  She has been taking Enbrel injections every 10 days which has been working well for her.  She states she had some interruption in the treatment and experienced increased pain during that time.  High risk medication use - Enbrel sq injections every 10 days-reduced dose due to neutropenia  - Plan: CBC with Differential/Platelet, COMPLETE METABOLIC PANEL WITH GFR today and then every 3 months to monitor for drug toxicity.  TB gold was negative on October 15, 2020.  She has been advised to get annual skin examination to screen for nonmelanoma skin cancer.  Updated information regarding immunization was placed in the AVS.  She  was also advised to stop Enbrel in case she develops an infection and resume after the infection resolves.  Sjogren's syndrome with other organ involvement (HCC) - ANA 1: 160 speckled, SSA positive, severe sicca symptoms: She did not like pilocarpine and discontinued.  Raynaud's syndrome without gangrene-currently not active.  Primary osteoarthritis of both hands - A new referral for pain management in [place] was placed at the last visit.   Primary osteoarthritis of both knees-lower extremity muscle strengthening exercises were discussed.  DDD (degenerative disc disease), cervical-she is going to pain management.  She will get nerve ablation towards the end of this month.  She is also had epidural injections to her cervical spine.  She has been pleased with the pain management referral.  She is also taking tramadol for pain management.  DDD (degenerative disc disease), lumbar-she continues to have some lower back pain.  Rash and other nonspecific skin eruption -she has intermittent rash.  She was seen by dermatologist and did not get a biopsy.  Fibromyalgia -she is having a flare of fibromyalgia.  Methocarbamol 500 mg twice daily as needed for muscle spasms.  Need for stretching exercises were discussed.  Primary insomnia-good sleep hygiene was discussed.  History of vitamin D deficiency-patient states that she recently had a DEXA scan and was told that she has osteopenia.  I do not have the results available.  Use of calcium and vitamin D was discussed.  History of hypertension-blood pressure was elevated today which could be related to recent cortisone shot.  History of hypercholesterolemia  History of gastroesophageal reflux (GERD)  History of depression - She is on Lexapro.  She discontinued Cymbalta due to side effects.  Orders: Orders Placed This Encounter  Procedures   CBC with Differential/Platelet   COMPLETE METABOLIC PANEL WITH GFR    No orders of the defined types  were placed in this encounter.    Follow-Up Instructions: Return in about 5 months (around 08/30/2021) for Rheumatoid arthritis.   Pollyann Savoy, MD  Note - This record has been created using Animal nutritionist.  Chart creation errors have been sought, but may not always  have been located. Such creation errors do not reflect on  the standard of medical care.

## 2021-03-30 ENCOUNTER — Encounter: Payer: Self-pay | Admitting: Rheumatology

## 2021-03-30 ENCOUNTER — Ambulatory Visit (INDEPENDENT_AMBULATORY_CARE_PROVIDER_SITE_OTHER): Payer: Medicare Other | Admitting: Rheumatology

## 2021-03-30 ENCOUNTER — Other Ambulatory Visit: Payer: Self-pay

## 2021-03-30 VITALS — BP 153/81 | HR 60 | Ht 64.0 in | Wt 128.6 lb

## 2021-03-30 DIAGNOSIS — M3509 Sicca syndrome with other organ involvement: Secondary | ICD-10-CM

## 2021-03-30 DIAGNOSIS — Z79899 Other long term (current) drug therapy: Secondary | ICD-10-CM

## 2021-03-30 DIAGNOSIS — M0579 Rheumatoid arthritis with rheumatoid factor of multiple sites without organ or systems involvement: Secondary | ICD-10-CM | POA: Diagnosis not present

## 2021-03-30 DIAGNOSIS — I7300 Raynaud's syndrome without gangrene: Secondary | ICD-10-CM

## 2021-03-30 DIAGNOSIS — Z8719 Personal history of other diseases of the digestive system: Secondary | ICD-10-CM

## 2021-03-30 DIAGNOSIS — Z8659 Personal history of other mental and behavioral disorders: Secondary | ICD-10-CM

## 2021-03-30 DIAGNOSIS — M797 Fibromyalgia: Secondary | ICD-10-CM

## 2021-03-30 DIAGNOSIS — F5101 Primary insomnia: Secondary | ICD-10-CM

## 2021-03-30 DIAGNOSIS — M5030 Other cervical disc degeneration, unspecified cervical region: Secondary | ICD-10-CM

## 2021-03-30 DIAGNOSIS — Z8639 Personal history of other endocrine, nutritional and metabolic disease: Secondary | ICD-10-CM

## 2021-03-30 DIAGNOSIS — M170 Bilateral primary osteoarthritis of knee: Secondary | ICD-10-CM

## 2021-03-30 DIAGNOSIS — M19042 Primary osteoarthritis, left hand: Secondary | ICD-10-CM

## 2021-03-30 DIAGNOSIS — R21 Rash and other nonspecific skin eruption: Secondary | ICD-10-CM

## 2021-03-30 DIAGNOSIS — Z5181 Encounter for therapeutic drug level monitoring: Secondary | ICD-10-CM

## 2021-03-30 DIAGNOSIS — Z8679 Personal history of other diseases of the circulatory system: Secondary | ICD-10-CM

## 2021-03-30 DIAGNOSIS — M5136 Other intervertebral disc degeneration, lumbar region: Secondary | ICD-10-CM

## 2021-03-30 DIAGNOSIS — M19041 Primary osteoarthritis, right hand: Secondary | ICD-10-CM

## 2021-03-30 LAB — CBC WITH DIFFERENTIAL/PLATELET
Absolute Monocytes: 434 cells/uL (ref 200–950)
Basophils Absolute: 19 cells/uL (ref 0–200)
Basophils Relative: 0.6 %
Eosinophils Absolute: 59 cells/uL (ref 15–500)
Eosinophils Relative: 1.9 %
HCT: 36.6 % (ref 35.0–45.0)
Hemoglobin: 11.6 g/dL — ABNORMAL LOW (ref 11.7–15.5)
Lymphs Abs: 1451 cells/uL (ref 850–3900)
MCH: 29.3 pg (ref 27.0–33.0)
MCHC: 31.7 g/dL — ABNORMAL LOW (ref 32.0–36.0)
MCV: 92.4 fL (ref 80.0–100.0)
MPV: 11.6 fL (ref 7.5–12.5)
Monocytes Relative: 14 %
Neutro Abs: 1138 cells/uL — ABNORMAL LOW (ref 1500–7800)
Neutrophils Relative %: 36.7 %
Platelets: 225 10*3/uL (ref 140–400)
RBC: 3.96 10*6/uL (ref 3.80–5.10)
RDW: 13.3 % (ref 11.0–15.0)
Total Lymphocyte: 46.8 %
WBC: 3.1 10*3/uL — ABNORMAL LOW (ref 3.8–10.8)

## 2021-03-30 LAB — COMPLETE METABOLIC PANEL WITH GFR
AG Ratio: 1.9 (calc) (ref 1.0–2.5)
ALT: 12 U/L (ref 6–29)
AST: 22 U/L (ref 10–35)
Albumin: 4.5 g/dL (ref 3.6–5.1)
Alkaline phosphatase (APISO): 44 U/L (ref 37–153)
BUN/Creatinine Ratio: 11 (calc) (ref 6–22)
BUN: 6 mg/dL — ABNORMAL LOW (ref 7–25)
CO2: 30 mmol/L (ref 20–32)
Calcium: 9.7 mg/dL (ref 8.6–10.4)
Chloride: 100 mmol/L (ref 98–110)
Creat: 0.57 mg/dL (ref 0.50–1.05)
Globulin: 2.4 g/dL (calc) (ref 1.9–3.7)
Glucose, Bld: 91 mg/dL (ref 65–99)
Potassium: 5.4 mmol/L — ABNORMAL HIGH (ref 3.5–5.3)
Sodium: 135 mmol/L (ref 135–146)
Total Bilirubin: 0.3 mg/dL (ref 0.2–1.2)
Total Protein: 6.9 g/dL (ref 6.1–8.1)
eGFR: 103 mL/min/{1.73_m2} (ref >=60)

## 2021-03-30 NOTE — Patient Instructions (Signed)
[facility] We placed an order today for your standing lab work.   Please have your standing labs drawn in November and every 3 months  If possible, please have your labs drawn 2 weeks prior to your appointment so that the provider can discuss your results at your appointment.  Please note that you may see your imaging and lab results in MyChart before we have reviewed them. We may be awaiting multiple results to interpret others before contacting you. Please allow our office up to 72 hours to thoroughly review all of the results before contacting the office for clarification of your results.  We have open lab daily: Monday through Thursday from 1:30-4:30 PM and Friday from 1:30-4:00 PM at the office of Dr. Pollyann Savoy, [facility].   Please be advised, all patients with office appointments requiring lab work will take precedent over walk-in lab work.  If possible, please come for your lab work on Monday and Friday afternoons, as you may experience shorter wait times. The office is located at [address], [place], [place] [address] No appointment is necessary.   Labs are drawn by [facility]. Please bring your co-pay at the time of your lab draw.  You may receive a bill from [facility] for your lab work.  If you wish to have your labs drawn at another location, please call the office 24 hours in advance to send orders.  If you have any questions regarding directions or hours of operation,  please call [phone].   As a reminder, please drink plenty of water prior to coming for your lab work. Thanks!   Vaccines You are taking a medication(s) that can suppress your immune system.  The following immunizations are recommended: Flu annually Covid-19  Td/Tdap (tetanus, diphtheria, pertussis) every 10 years Pneumonia (Prevnar 15 then Pneumovax 23 at least 1 year apart.  Alternatively, can take Prevnar 20 without needing additional dose) Shingrix (after age 62):  2 doses from 4 weeks to 6 months apart  Please check with your PCP to make sure you are up to date.   If you test POSITIVE for COVID19 and have MILD to MODERATE symptoms: First, call your PCP if you would like to receive COVID19 treatment AND Hold your medications during the infection and for at least 1 week after your symptoms have resolved: Injectable medication (Benlysta, Cimzia, Cosentyx, Enbrel, Humira, Orencia, Remicade, Simponi, Stelara, Taltz, Tremfya) Methotrexate Leflunomide (Arava) Azathioprine Mycophenolate (Cellcept) Osborne [place], or Rinvoq Otezla If you take Actemra or Kevzara, you DO NOT need to hold these for COVID19 infection.  If you test POSITIVE for COVID19 and have NO symptoms: First, call your PCP if you would like to receive COVID19 treatment AND Hold your medications for at least 10 days after the day that you tested positive Injectable medication (Benlysta, Cimzia, Cosentyx, Enbrel, Humira, Orencia, Remicade, Simponi, Stelara, Taltz, Tremfya) Methotrexate Leflunomide (Arava) Azathioprine Mycophenolate (Cellcept) Osborne [place], or Rinvoq Otezla If you take Actemra or Kevzara, you DO NOT need to hold these for COVID19 infection.  If you have signs or symptoms of an infection or start antibiotics: First, call your PCP for workup of your infection. Hold your medication through the infection, until you complete your antibiotics, and until symptoms resolve if you take the following: Injectable medication (Actemra, Benlysta, Cimzia, Cosentyx, Enbrel, Humira, Kevzara, Orencia, Remicade, Simponi, Stelara, Taltz, Tremfya) Methotrexate Leflunomide (Arava) Mycophenolate (Cellcept) Osborne [place], or Rinvoq  Please get annual skin examination by dermatologist to screen for nonmelanoma skin  cancer  Heart Disease Prevention   Your inflammatory disease increases your risk of heart disease which includes heart attack, stroke, atrial fibrillation (irregular  heartbeats), high blood pressure, heart failure and atherosclerosis (plaque in the arteries).  It is important to reduce your risk by:   Keep blood pressure, cholesterol, and blood sugar at healthy levels   Smoking Cessation   Maintain a healthy weight  BMI 20-25   Eat a healthy diet  Plenty of fresh fruit, vegetables, and whole grains  Limit saturated fats, foods high in sodium, and added sugars  DASH and Mediterranean diet   Increase physical activity  Recommend moderate physically activity for 150 minutes per week/ 30 minutes a day for five days a week These can be broken up into three separate ten-minute sessions during the day.   Reduce Stress  Meditation, slow breathing exercises, yoga, coloring books  Dental visits twice a year

## 2021-03-31 NOTE — Progress Notes (Signed)
WBC count is low and stable.  Hemoglobin is low and stable.  Potassium is elevated, most likely a hemolyzed sample

## 2021-04-05 ENCOUNTER — Other Ambulatory Visit: Payer: Self-pay

## 2021-04-05 ENCOUNTER — Other Ambulatory Visit: Payer: Self-pay | Admitting: Rheumatology

## 2021-04-05 DIAGNOSIS — M0579 Rheumatoid arthritis with rheumatoid factor of multiple sites without organ or systems involvement: Secondary | ICD-10-CM

## 2021-04-05 MED ORDER — ENBREL MINI 50 MG/ML ~~LOC~~ SOCT: SUBCUTANEOUS | 0 refills | Status: DC

## 2021-04-05 NOTE — Telephone Encounter (Signed)
Next Visit: 09/07/2020  Last Visit: 03/30/2021  Last Fill: 02/22/2021  IR:JJOACZYSAY arthritis with rheumatoid factor of multiple sites without organ or systems involvement   Current Dose per office note 03/30/2021: Enbrel sq injections every 10 days-reduced dose due to neutropenia   Labs: 03/30/2021 WBC count is low and stable.  Hemoglobin is low and stable.  Potassium is elevated, most likely a hemolyzed sample  TB Gold: 10/15/2020 Neg    Okay to refill Enbrel?

## 2021-04-08 ENCOUNTER — Telehealth: Payer: Self-pay | Admitting: *Deleted

## 2021-04-08 NOTE — Telephone Encounter (Signed)
Patient called the office requesting a refill on Tramadol. Patient is seeing pain management and has been since May 2022. Patient advised we would not be able to refill this medication since she is seeing pain management. Patient advised to contact her pain management doctor. Patient states "I wish I would have been told this at my last appointment".

## 2021-08-11 ENCOUNTER — Other Ambulatory Visit: Payer: Self-pay | Admitting: Rheumatology

## 2021-08-11 DIAGNOSIS — M0579 Rheumatoid arthritis with rheumatoid factor of multiple sites without organ or systems involvement: Secondary | ICD-10-CM

## 2021-08-11 NOTE — Telephone Encounter (Signed)
Next Visit: 09/07/2021  Last Visit: 03/30/2021  Last Fill: 04/05/2021  FO:YDXAJOINOM arthritis with rheumatoid factor of multiple sites without organ or systems involvement   Current Dose per office note 03/30/2021: Enbrel sq injections every 10 days-reduced dose due to neutropenia   Labs: 08/03/2021 Sodium 134, Chloride 96, CO2 32, WBC 3.5, RBC 3.93, Hgb 11.8, Hct 34.5  TB Gold: 10/15/2020 Neg   Okay to refill Enbrel?

## 2021-08-24 NOTE — Progress Notes (Deleted)
Office Visit Note  Patient: Mary Lane             Date of Birth: 05/02/1961           MRN: [id number]             PCP: Sinclair Ship, MD Referring: Sinclair Ship, MD Visit Date: 09/07/2021 Occupation: @GUAROCC @  Subjective:  No chief complaint on file.   History of Present Illness: Mary Lane is a 61 y.o. female ***   Activities of Daily Living:  Patient reports morning stiffness for *** {minute/hour:19697}.   Patient {ACTIONS;DENIES/REPORTS:21021675::"Denies"} nocturnal pain.  Difficulty dressing/grooming: {ACTIONS;DENIES/REPORTS:21021675::"Denies"} Difficulty climbing stairs: {ACTIONS;DENIES/REPORTS:21021675::"Denies"} Difficulty getting out of chair: {ACTIONS;DENIES/REPORTS:21021675::"Denies"} Difficulty using hands for taps, buttons, cutlery, and/or writing: {ACTIONS;DENIES/REPORTS:21021675::"Denies"}  No Rheumatology ROS completed.   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 or systems involvement ([place]) 09/25/2017   Raynaud's syndrome without gangrene 09/25/2017   Medication monitoring encounter 09/25/2017   Primary osteoarthritis of both hands 09/25/2017   Primary osteoarthritis of both knees 09/25/2017   DDD (degenerative disc disease), lumbar 09/25/2017   Primary insomnia 09/25/2017   Fibromyalgia 09/25/2017   Mouth sores 09/25/2017   Essential hypertension 09/25/2017   History of gastroesophageal reflux (GERD) 09/25/2017   History of depression 09/25/2017   Vitamin D deficiency 09/25/2017   History of hypercholesterolemia 09/25/2017   High risk medication use 09/06/2016    Past Medical History:  Diagnosis Date   Allergies    Chronic GERD    DDD (degenerative disc disease), cervical    DDD (degenerative disc disease), lumbar    DDD (degenerative disc disease),  thoracic    Depression    Fibromyalgia    High cholesterol    Hypertension    Klippel-Feil syndrome    Low sodium levels    Osteoarthritis    Raynaud disease    Rheumatoid arthritis (HCC)    Shoulder pain, right    Sjogren's disease ([place])    TMJ disease     Family History  Problem Relation Age of Onset   Hypertension Mother    Dementia Mother    Alzheimer's disease Mother    Prostate cancer Father    Hypertension Father    Hypertension Sister    Hypercholesterolemia Sister    Bipolar disorder Sister    Depression Sister    Healthy Daughter    Healthy Son    Past Surgical History:  Procedure Laterality Date   ABDOMINAL HYSTERECTOMY     APPENDECTOMY     BACK SURGERY     CARPAL TUNNEL RELEASE Bilateral    CHOLECYSTECTOMY     epidural     cervical and cervical block   ERCP     KNEE SURGERY Bilateral    TUBAL LIGATION     Social History   Social History Narrative   Not on file   Immunization History  Administered Date(s) Administered   Moderna Sars-Covid-2 Vaccination 03/29/2020, 05/06/2020   Zoster, Live 09/02/2013     Objective: Vital Signs: There were no vitals taken for this visit.   Physical Exam   Musculoskeletal Exam: ***  CDAI Exam: CDAI Score: -- Patient Global: --; Provider Global: -- Swollen: --; Tender: -- Joint Exam 09/07/2021   No joint exam has been documented for this visit   There is currently no information documented  on the homunculus. Go to the Rheumatology activity and complete the homunculus joint exam.  Investigation: No additional findings.  Imaging: No results found.  Recent Labs: Lab Results  Component Value Date   WBC 3.1 (L) 03/30/2021   HGB 11.6 (L) 03/30/2021   PLT 225 03/30/2021   NA 135 03/30/2021   K 5.4 (H) 03/30/2021   CL 100 03/30/2021   CO2 30 03/30/2021   GLUCOSE 91 03/30/2021   BUN 6 (L) 03/30/2021   CREATININE 0.57 03/30/2021   BILITOT 0.3 03/30/2021   ALKPHOS 52 08/28/2016   AST 22 03/30/2021    ALT 12 03/30/2021   PROT 6.9 03/30/2021   ALBUMIN 4.6 08/28/2016   CALCIUM 9.7 03/30/2021   GFRAA 113 12/20/2020   QFTBGOLDPLUS NEGATIVE 10/15/2020    Speciality Comments: Patient may try to space Enbrel every 2 weeks  Procedures:  No procedures performed Allergies: Baclofen, Gabapentin, Ivp dye [iodinated contrast media], and Sulfa antibiotics   Assessment / Plan:     Visit Diagnoses: No diagnosis found.  Orders: No orders of the defined types were placed in this encounter.  No orders of the defined types were placed in this encounter.   Face-to-face time spent with patient was *** minutes. Greater than 50% of time was spent in counseling and coordination of care.  Follow-Up Instructions: No follow-ups on file.   Earnestine Mealing, CMA  Note - This record has been created using Editor, commissioning.  Chart creation errors have been sought, but may not always  have been located. Such creation errors do not reflect on  the standard of medical care.

## 2021-09-07 ENCOUNTER — Ambulatory Visit: Payer: Medicare Other | Admitting: Physician Assistant

## 2021-09-07 DIAGNOSIS — M5136 Other intervertebral disc degeneration, lumbar region: Secondary | ICD-10-CM

## 2021-09-07 DIAGNOSIS — Z8659 Personal history of other mental and behavioral disorders: Secondary | ICD-10-CM

## 2021-09-07 DIAGNOSIS — F5101 Primary insomnia: Secondary | ICD-10-CM

## 2021-09-07 DIAGNOSIS — Z8679 Personal history of other diseases of the circulatory system: Secondary | ICD-10-CM

## 2021-09-07 DIAGNOSIS — Z8639 Personal history of other endocrine, nutritional and metabolic disease: Secondary | ICD-10-CM

## 2021-09-07 DIAGNOSIS — M170 Bilateral primary osteoarthritis of knee: Secondary | ICD-10-CM

## 2021-09-07 DIAGNOSIS — Z79899 Other long term (current) drug therapy: Secondary | ICD-10-CM

## 2021-09-07 DIAGNOSIS — M0579 Rheumatoid arthritis with rheumatoid factor of multiple sites without organ or systems involvement: Secondary | ICD-10-CM

## 2021-09-07 DIAGNOSIS — M3509 Sicca syndrome with other organ involvement: Secondary | ICD-10-CM

## 2021-09-07 DIAGNOSIS — M19041 Primary osteoarthritis, right hand: Secondary | ICD-10-CM

## 2021-09-07 DIAGNOSIS — M5030 Other cervical disc degeneration, unspecified cervical region: Secondary | ICD-10-CM

## 2021-09-07 DIAGNOSIS — M797 Fibromyalgia: Secondary | ICD-10-CM

## 2021-09-07 DIAGNOSIS — R21 Rash and other nonspecific skin eruption: Secondary | ICD-10-CM

## 2021-09-07 DIAGNOSIS — I7300 Raynaud's syndrome without gangrene: Secondary | ICD-10-CM

## 2021-09-07 DIAGNOSIS — Z8719 Personal history of other diseases of the digestive system: Secondary | ICD-10-CM

## 2021-09-12 ENCOUNTER — Telehealth: Payer: Self-pay

## 2021-09-12 NOTE — Telephone Encounter (Signed)
Received medical records request via fax from [facility] requesting TB gold and hepatitis panels. I have faxed lab results to number provided and signed medical release has been sent to the scan center.

## 2021-11-30 ENCOUNTER — Other Ambulatory Visit: Payer: Self-pay | Admitting: Physician Assistant

## 2021-11-30 DIAGNOSIS — M0579 Rheumatoid arthritis with rheumatoid factor of multiple sites without organ or systems involvement: Secondary | ICD-10-CM
